# Patient Record
Sex: Female | Born: 1950 | Race: White | Hispanic: No | Smoking: Former smoker
Health system: Southern US, Community
[De-identification: ages and names within clinical notes are randomized; demographics above are authoritative.]

## PROBLEM LIST (undated history)

## (undated) DIAGNOSIS — M1990 Unspecified osteoarthritis, unspecified site: Secondary | ICD-10-CM

## (undated) DIAGNOSIS — I10 Essential (primary) hypertension: Secondary | ICD-10-CM

## (undated) DIAGNOSIS — F419 Anxiety disorder, unspecified: Secondary | ICD-10-CM

## (undated) DIAGNOSIS — F32A Depression, unspecified: Secondary | ICD-10-CM

## (undated) DIAGNOSIS — Z8489 Family history of other specified conditions: Secondary | ICD-10-CM

## (undated) HISTORY — PX: ANKLE FRACTURE SURGERY: SHX122

---

## 1979-12-09 DIAGNOSIS — O009 Unspecified ectopic pregnancy without intrauterine pregnancy: Secondary | ICD-10-CM

## 1979-12-09 HISTORY — DX: Unspecified ectopic pregnancy without intrauterine pregnancy: O00.90

## 1979-12-09 HISTORY — PX: UNILATERAL SALPINGECTOMY: SHX6160

## 2010-09-02 ENCOUNTER — Ambulatory Visit: Payer: Self-pay | Admitting: Emergency Medicine

## 2010-09-02 DIAGNOSIS — S81809A Unspecified open wound, unspecified lower leg, initial encounter: Secondary | ICD-10-CM

## 2010-09-02 DIAGNOSIS — S91009A Unspecified open wound, unspecified ankle, initial encounter: Secondary | ICD-10-CM

## 2010-09-02 DIAGNOSIS — S81009A Unspecified open wound, unspecified knee, initial encounter: Secondary | ICD-10-CM | POA: Insufficient documentation

## 2010-09-13 ENCOUNTER — Ambulatory Visit: Payer: Self-pay | Admitting: Emergency Medicine

## 2011-01-07 NOTE — Assessment & Plan Note (Signed)
Summary: CUT ON BACK OF RIGHT LEG/TJ   Vital Signs:  Patient Profile:   60 Years Old Female CC:      laceration to right lower thigh  Height:     62 inches Weight:      165 pounds O2 Sat:      98 % O2 treatment:    Room Air Temp:     98.4 degrees F oral Pulse rate:   91 / minute Resp:     14 per minute BP sitting:   167 / 91  (right arm) Cuff size:   large  Vitals Entered By: Lajean Saver RN (September 02, 2010 12:57 PM)                  Updated Prior Medication List: KEFLEX 500 MG CAPS (CEPHALEXIN) 1 cap by mouth three times a day for 7 days CLONAZEPAM 1 MG TABS (CLONAZEPAM)   Current Allergies: No known allergies History of Present Illness History from: patient Chief Complaint: laceration to right lower thigh  History of Present Illness: Laceration on her R leg.  She was at [employer] here in [place] and somehow the bathroom doorfell off the hinges and hit her in the leg when another customer opened the door.  She reports that the wooden door cut her her in the process.  Bleeding wass present but stopped with pressure.  She reports sharp constant pain.  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 She is able to walk normally.  She didn't have any other associated injuries.  REVIEW OF SYSTEMS Constitutional Symptoms      Denies fever, chills, night sweats, weight loss, weight gain, and fatigue.  Eyes       Denies change in vision, eye pain, eye discharge, glasses, contact lenses, and eye surgery. Ear/Nose/Throat/Mouth       Denies hearing loss/aids, change in hearing, ear pain, ear discharge, dizziness, frequent runny nose, frequent nose bleeds, sinus problems, sore throat, hoarseness, and tooth pain or bleeding.  Respiratory       Denies dry cough, productive cough, wheezing, shortness of breath, asthma, bronchitis, and emphysema/COPD.  Cardiovascular       Denies murmurs, chest pain, and tires easily with exhertion.    Gastrointestinal       Denies stomach  pain, nausea/vomiting, diarrhea, constipation, blood in bowel movements, and indigestion. Genitourniary       Denies painful urination, kidney stones, and loss of urinary control. Neurological       Denies paralysis, seizures, and fainting/blackouts. Musculoskeletal       Denies muscle pain, joint pain, joint stiffness, decreased range of motion, redness, swelling, muscle weakness, and gout.  Skin       Denies bruising, unusual mles/lumps or sores, and hair/skin or nail changes.      Comments: laceration to right thigh Psych       Denies mood changes, temper/anger issues, anxiety/stress, speech problems, depression, and sleep problems. Other Comments: Patient was coming into bathroom at [facility] when the door fell off the hinges, cutting her leg. She has a laceration to her right lower thigh. Her last TDap was about 7 years ago. TDaP given.   Past History:  Past Medical History: Ectopic pregnancy  Past Surgical History: Denies surgical history  Family History: NA  Social History: Never Smoked Alcohol use-yes 3-4/week Drug use-no Smoking Status:  never Drug Use:  no Physical Exam General appearance: well developed, well nourished, no acute distress Head: normocephalic, atraumatic  Extremities: FROM of her knee Neurological: normal sensation distally Skin: see below.  Also w/ associated bruising around the impact site MSE: oriented to time, place, and person R leg: right lateral distal thigh laceration approx 4.5cm in length in the shape of an upside down "V".  It is a superficial flap of skin and only down to the subcutaneous fat, not hitting any muscles or tendons.  The bleeding has stopped.  Mild tenderness to palpation.  No foreign bodies are seen after numbing and exploring the wound. Assessment New Problems: WOUND, OPEN, LEG, WITHOUT COMPLICATION (ICD-891.0)  R leg laceration  Patient Education: Patient and/or caregiver instructed in the following: rest, fluids,  Tylenol prn.  Plan New Medications/Changes: KEFLEX 500 MG CAPS (CEPHALEXIN) 1 cap by mouth three times a day for 7 days  #21 x 0, 09/02/2010, Hoyt Koch MD  New Orders: New Patient Level IV [phone] Repair Superficial Wound(s) 2.6cm to 7.5cm [12002] Dressing 4x4 UP [A6402] Tdap => 82yrs IM [90715] Admin 1st Vaccine [90471] Planning Comments:   Will treat with prophylactic antibiotics since I don't know how dirty or clean the door was and because it's a large cut Tetanus shot given because last one was 2005 No getting wound wet for 2 days.  Another day after that, should have would checked by a medical professional (here or PCP).  Then stitches will be removed 10 days from today. Avoid heavy exercise so that the wound doesn't reopen.  If so, please return to clinic or see PCP or ER.   The patient and/or caregiver has been counseled thoroughly with regard to medications prescribed including dosage, schedule, interactions, rationale for use, and possible side effects and they verbalize understanding.  Diagnoses and expected course of recovery discussed and will return if not improved as expected or if the condition worsens. Patient and/or caregiver verbalized understanding.   PROCEDURE:  Suture Site: R lateral thigh Size: 4cm in the shape of a "V" Number of Lacerations: 1 Procedure: Discussed benefits and risks of procedure and verbal consent obtained.  Using sterile technique and local 2% lidocaine with epinephrine, cleansed wound with betadine followed by copious lavage with normal saline.  Wound carefully inspected for debris and foreign bodies; none found.  Wound closed with #7 , 4-0 interrupted nylon sutures.  Bacitracin and non-stick sterile dressing applied.  Wound precautions explained to patient.  Prescriptions: KEFLEX 500 MG CAPS (CEPHALEXIN) 1 cap by mouth three times a day for 7 days  #21 x 0   Entered and Authorized by:   Hoyt Koch MD   Signed by:   Hoyt Koch MD on 09/02/2010   Method used:   Print then Give to Patient   RxID:   [id number]   Orders Added: 1)  New Patient Level IV [95621] 2)  Repair Superficial Wound(s) 2.6cm to 7.5cm [12002] 3)  Dressing 4x4 UP [A6402] 4)  Tdap => 21yrs IM [90715] 5)  Admin 1st Vaccine [30865]   Immunizations Administered:  Tetanus Vaccine:    Vaccine Type: Tdap    Site: left deltoid    Mfr: GlaxoSmithKline    Dose: 0.5 ml    Route: IM    Given by: Lajean Saver RN    Exp. Date: 08/28/2012    Lot #: [id number]    VIS given: 10/25/08 version given September 02, 2010.

## 2011-01-07 NOTE — Assessment & Plan Note (Signed)
Summary: suture removal   followup  Suture removal from posterior right leg. Temp: 98.6 BP: 158/95 HR: 85 RR: O2)2:95% Weight: 163 lbs  s: here for suture removal.  she did fine, no signs of infection.  no pain, no issues.  o: wound has closed well, no dehiscence.  no signs of infection (erythema, warmth, tenderness)  a: R leg wound  p: 7 sutures removed today with no difficulty.  then covered with steristrips.  keep leg clean, dry, and protected for another 1-2 weeks.

## 2011-12-09 DIAGNOSIS — C801 Malignant (primary) neoplasm, unspecified: Secondary | ICD-10-CM

## 2011-12-09 HISTORY — DX: Malignant (primary) neoplasm, unspecified: C80.1

## 2011-12-09 HISTORY — PX: MASTECTOMY: SHX3

## 2020-04-26 ENCOUNTER — Other Ambulatory Visit: Payer: Self-pay | Admitting: Internal Medicine

## 2020-04-26 DIAGNOSIS — R5381 Other malaise: Secondary | ICD-10-CM

## 2020-07-24 ENCOUNTER — Other Ambulatory Visit: Payer: Self-pay | Admitting: Internal Medicine

## 2020-07-24 DIAGNOSIS — M8580 Other specified disorders of bone density and structure, unspecified site: Secondary | ICD-10-CM

## 2020-10-29 DIAGNOSIS — L570 Actinic keratosis: Secondary | ICD-10-CM | POA: Diagnosis not present

## 2020-11-06 ENCOUNTER — Ambulatory Visit
Admission: RE | Admit: 2020-11-06 | Discharge: 2020-11-06 | Disposition: A | Discharge status: Home / Self Care | Payer: Medicare HMO | Source: Ambulatory Visit | Attending: Internal Medicine | Admitting: Internal Medicine

## 2020-11-06 ENCOUNTER — Other Ambulatory Visit: Payer: Self-pay

## 2020-11-06 DIAGNOSIS — Z780 Asymptomatic menopausal state: Secondary | ICD-10-CM | POA: Diagnosis not present

## 2020-11-06 DIAGNOSIS — M810 Age-related osteoporosis without current pathological fracture: Secondary | ICD-10-CM | POA: Diagnosis not present

## 2020-11-06 DIAGNOSIS — M8580 Other specified disorders of bone density and structure, unspecified site: Secondary | ICD-10-CM

## 2020-11-08 DIAGNOSIS — L570 Actinic keratosis: Secondary | ICD-10-CM | POA: Diagnosis not present

## 2022-06-12 ENCOUNTER — Ambulatory Visit: Payer: Medicare Other | Admitting: Orthopaedic Surgery

## 2022-06-12 ENCOUNTER — Ambulatory Visit (INDEPENDENT_AMBULATORY_CARE_PROVIDER_SITE_OTHER): Payer: Medicare Other

## 2022-06-12 VITALS — Ht 61.0 in | Wt 139.0 lb

## 2022-06-12 DIAGNOSIS — M1611 Unilateral primary osteoarthritis, right hip: Secondary | ICD-10-CM | POA: Diagnosis not present

## 2022-06-12 DIAGNOSIS — M25551 Pain in right hip: Secondary | ICD-10-CM | POA: Diagnosis not present

## 2022-06-12 NOTE — Progress Notes (Signed)
Office Visit Note   Patient: Virginia Castillo           Date of Birth: 10/19/1951           MRN: [id number] Visit Date: 06/12/2022              Requested by: Sueanne Margarita, [address],  [place] [address] PCP: Sueanne Margarita, DO   Assessment & Plan: Visit Diagnoses:  1. Primary osteoarthritis of right hip     Plan: Impression is advanced right hip DJD.  Conservative management have now ceased to provide any relief.  Pain is interfering with quality life and ADLs.  Based on her options she has elected to move forward with a right total hip replacement.  Risk benefits prognosis reviewed.  Explained that I typically will keep patients overnight for pain control, observation and mobilization with PT and discharge home the next morning.  She is agreeable to the plan.  Virginia Castillo met with the patient today.  Follow-Up Instructions: No follow-ups on file.   Orders:  Orders Placed This Encounter  Procedures   XR HIP UNILAT W OR W/O PELVIS 2-3 VIEWS RIGHT   No orders of the defined types were placed in this encounter.     Procedures: No procedures performed   Clinical Data: No additional findings.   Subjective: Chief Complaint  Patient presents with   Right Hip - Pain    HPI Virginia Castillo is a very pleasant 71 year old female here for advanced right hip DJD.  Had been a previous patient at [facility] and had seen Dr. Lyla Glassing and was scheduled for surgery but had to be canceled due to personal and family reasons.  She states that she subsequently did not have a good experience when she returned back to their office and requested a referral to our office for further evaluation and treatment of her right hip DJD.  Prior to her office she had undergone several cortisone injections in the hip which gave great but temporary relief.  Oral medications no longer provide relief.  She is unable to walk any significant distances without severe pain.  Review of Systems  Constitutional:  Negative.   HENT: Negative.    Eyes: Negative.   Respiratory: Negative.    Cardiovascular: Negative.   Endocrine: Negative.   Musculoskeletal: Negative.   Neurological: Negative.   Hematological: Negative.   Psychiatric/Behavioral: Negative.    All other systems reviewed and are negative.    Objective: Vital Signs: Ht $RemoveB'5\' 1"'XAQClAei$  (1.549 m)   Wt 139 lb (63 kg)   BMI 26.26 kg/m   Physical Exam Vitals and nursing note reviewed.  Constitutional:      Appearance: She is well-developed.  HENT:     Head: Normocephalic and atraumatic.  Pulmonary:     Effort: Pulmonary effort is normal.  Abdominal:     Palpations: Abdomen is soft.  Musculoskeletal:     Cervical back: Neck supple.  Skin:    General: Skin is warm.     Capillary Refill: Capillary refill takes less than 2 seconds.  Neurological:     Mental Status: She is alert and oriented to person, place, and time.  Psychiatric:        Behavior: Behavior normal.        Thought Content: Thought content normal.        Judgment: Judgment normal.     Ortho Exam Examination of the right hip shows antalgic gait.  Pain with hip flexion and circumduction as well  as internal rotation. Specialty Comments:  No specialty comments available.  Imaging: XR HIP UNILAT W OR W/O PELVIS 2-3 VIEWS RIGHT  Result Date: 06/12/2022 Advanced degenerative joint disease with bone-on-bone joint space narrowing of right hip joint.     PMFS History: Patient Active Problem List   Diagnosis Date Noted   Primary osteoarthritis of right hip 06/12/2022   WOUND, OPEN, LEG, WITHOUT COMPLICATION 43/12/4838   No past medical history on file.  No family history on file.   Social History   Occupational History   Not on file  Tobacco Use   Smoking status: Not on file   Smokeless tobacco: Not on file  Substance and Sexual Activity   Alcohol use: Not on file   Drug use: Not on file   Sexual activity: Not on file

## 2022-06-16 ENCOUNTER — Other Ambulatory Visit: Payer: Self-pay

## 2022-06-23 ENCOUNTER — Telehealth: Payer: Self-pay | Admitting: Orthopaedic Surgery

## 2022-06-23 NOTE — Telephone Encounter (Signed)
Called and LMOM that handicap placard application is up front.

## 2022-06-23 NOTE — Telephone Encounter (Signed)
yes

## 2022-06-23 NOTE — Telephone Encounter (Signed)
Pt called requesting another handicap placid form. Pt states she lost her's to take to the [facility]. Please call pt when ready for pick up. Pt phone number is [phone].

## 2022-07-07 NOTE — Progress Notes (Signed)
Surgical Instructions    Your procedure is scheduled on Wednesday August 9th.  Report to [facility] Main Entrance "A" at 945 A.M., then check in with the Admitting office.  Call this number if you have problems the morning of surgery:  [phone]   If you have any questions prior to your surgery date call [phone]: Open Monday-Friday 8am-4pm    Remember:  Do not eat after midnight the night before your surgery  You may drink clear liquids until 915 the morning of your surgery.   Clear liquids allowed are: Water, Non-Citrus Juices (without pulp), Carbonated Beverages, Clear Tea, Black Coffee ONLY (NO MILK, CREAM OR POWDERED CREAMER of any kind), and Gatorade   Enhanced Recovery after Surgery for Orthopedics Enhanced Recovery after Surgery is a protocol used to improve the stress on your body and your recovery after surgery.  Patient Instructions  The day of surgery (if you do NOT have diabetes):  Drink ONE (1) Pre-Surgery Clear Ensure by ___915__ am the morning of surgery   This drink was given to you during your hospital  pre-op appointment visit. Nothing else to drink after completing the  Pre-Surgery Clear Ensure.         If you have questions, please contact your surgeon's office.     Take these medicines the morning of surgery with A SIP OF WATER: amLODipine (NORVASC) 5 MG tablet atorvastatin (LIPITOR) 40 MG tablet sertraline (ZOLOFT) 50 MG tablet   As of today, STOP taking any Aspirin (unless otherwise instructed by your surgeon) Voltaren, Aleve, Naproxen, Ibuprofen, Motrin, Advil, Goody's, BC's, all herbal medications, fish oil, and all vitamins.           Do not wear jewelry or makeup Do not wear lotions, powders, perfumes, or deodorant. Do not shave 48 hours prior to surgery. Do not bring valuables to the hospital. Do not wear nail polish, gel polish, artificial nails, or any other type of covering on natural nails (fingers and toes) If you have artificial  nails or gel coating that need to be removed by a nail salon, please have this removed prior to surgery. Artificial nails or gel coating may interfere with anesthesia's ability to adequately monitor your vital signs.  [facility] is not responsible for any belongings or valuables. .   Do NOT Smoke (Tobacco/Vaping)  24 hours prior to your procedure  If you use a CPAP at night, you may bring your mask for your overnight stay.   Contacts, glasses, hearing aids, dentures or partials may not be worn into surgery, please bring cases for these belongings   For patients admitted to the hospital, discharge time will be determined by your treatment team.   Patients discharged the day of surgery will not be allowed to drive home, and someone needs to stay with them for 24 hours.   SURGICAL WAITING ROOM VISITATION Patients having surgery or a procedure may have no more than 2 support people in the waiting area - these visitors may rotate.   Children under the age of 28 must have an adult with them who is not the patient. If the patient needs to stay at the hospital during part of their recovery, the visitor guidelines for inpatient rooms apply. Pre-op nurse will coordinate an appropriate time for 1 support person to accompany patient in pre-op.  This support person may not rotate.   Please refer to the [facility] website for the visitor guidelines for Inpatients (after your surgery is over and you are  in a regular room).    Special instructions:    Oral Hygiene is also important to reduce your risk of infection.  Remember - BRUSH YOUR TEETH THE MORNING OF SURGERY WITH YOUR REGULAR TOOTHPASTE   [facility]- Preparing For Surgery  Before surgery, you can play an important role. Because skin is not sterile, your skin needs to be as free of germs as possible. You can reduce the number of germs on your skin by washing with CHG (chlorahexidine gluconate) Soap before surgery.  CHG is an antiseptic  cleaner which kills germs and bonds with the skin to continue killing germs even after washing.     Please do not use if you have an allergy to CHG or antibacterial soaps. If your skin becomes reddened/irritated stop using the CHG.  Do not shave (including legs and underarms) for at least 48 hours prior to first CHG shower. It is OK to shave your face.  Please follow these instructions carefully.     Shower the NIGHT BEFORE SURGERY and the MORNING OF SURGERY with CHG Soap.   If you chose to wash your hair, wash your hair first as usual with your normal shampoo. After you shampoo, rinse your hair and body thoroughly to remove the shampoo.  Then [employer] and genitals (private parts) with your normal soap and rinse thoroughly to remove soap.  After that Use CHG Soap as you would any other liquid soap. You can apply CHG directly to the skin and wash gently with a scrungie or a clean washcloth.   Apply the CHG Soap to your body ONLY FROM THE NECK DOWN.  Do not use on open wounds or open sores. Avoid contact with your eyes, ears, mouth and genitals (private parts). Wash Face and genitals (private parts)  with your normal soap.   Wash thoroughly, paying special attention to the area where your surgery will be performed.  Thoroughly rinse your body with warm water from the neck down.  DO NOT shower/wash with your normal soap after using and rinsing off the CHG Soap.  Pat yourself dry with a CLEAN TOWEL.  Wear CLEAN PAJAMAS to bed the night before surgery  Place CLEAN SHEETS on your bed the night before your surgery  DO NOT SLEEP WITH PETS.   Day of Surgery:  Take a shower with CHG soap. Wear Clean/Comfortable clothing the morning of surgery Do not apply any deodorants/lotions.   Remember to brush your teeth WITH YOUR REGULAR TOOTHPASTE.    If you received a COVID test during your pre-op visit, it is requested that you wear a mask when out in public, stay away from anyone that may not  be feeling well, and notify your surgeon if you develop symptoms. If you have been in contact with anyone that has tested positive in the last 10 days, please notify your surgeon.    Please read over the following fact sheets that you were given.

## 2022-07-08 ENCOUNTER — Encounter (HOSPITAL_COMMUNITY)
Admission: RE | Admit: 2022-07-08 | Discharge: 2022-07-08 | Disposition: A | Discharge status: Home / Self Care | Payer: Medicare Other | Source: Ambulatory Visit | Attending: Orthopaedic Surgery | Admitting: Orthopaedic Surgery

## 2022-07-08 ENCOUNTER — Other Ambulatory Visit: Payer: Self-pay

## 2022-07-08 ENCOUNTER — Encounter (HOSPITAL_COMMUNITY): Payer: Self-pay | Admitting: *Deleted

## 2022-07-08 VITALS — BP 147/89 | HR 78 | Temp 98.2°F | Ht 61.0 in | Wt 139.8 lb

## 2022-07-08 DIAGNOSIS — M1611 Unilateral primary osteoarthritis, right hip: Secondary | ICD-10-CM | POA: Diagnosis not present

## 2022-07-08 DIAGNOSIS — Z01818 Encounter for other preprocedural examination: Secondary | ICD-10-CM

## 2022-07-08 HISTORY — DX: Essential (primary) hypertension: I10

## 2022-07-08 HISTORY — DX: Anxiety disorder, unspecified: F41.9

## 2022-07-08 HISTORY — DX: Family history of other specified conditions: Z84.89

## 2022-07-08 HISTORY — DX: Depression, unspecified: F32.A

## 2022-07-08 LAB — COMPREHENSIVE METABOLIC PANEL
ALT: 16 U/L (ref 0–44)
AST: 22 U/L (ref 15–41)
Albumin: 4 g/dL (ref 3.5–5.0)
Alkaline Phosphatase: 52 U/L (ref 38–126)
Anion gap: 7 (ref 5–15)
BUN: 15 mg/dL (ref 8–23)
CO2: 29 mmol/L (ref 22–32)
Calcium: 9.4 mg/dL (ref 8.9–10.3)
Chloride: 102 mmol/L (ref 98–111)
Creatinine, Ser: 0.64 mg/dL (ref 0.44–1.00)
GFR, Estimated: >60 mL/min (ref >60)
Glucose, Bld: 97 mg/dL (ref 70–99)
Potassium: 3.5 mmol/L (ref 3.5–5.1)
Sodium: 138 mmol/L (ref 135–145)
Total Bilirubin: 0.7 mg/dL (ref 0.3–1.2)
Total Protein: 7.3 g/dL (ref 6.5–8.1)

## 2022-07-08 LAB — TYPE AND SCREEN
ABO/RH(D): A POS
Antibody Screen: NEGATIVE

## 2022-07-08 LAB — CBC
HCT: 41.6 % (ref 36.0–46.0)
Hemoglobin: 13.1 g/dL (ref 12.0–15.0)
MCH: 29.6 pg (ref 26.0–34.0)
MCHC: 31.5 g/dL (ref 30.0–36.0)
MCV: 93.9 fL (ref 80.0–100.0)
Platelets: 232 10*3/uL (ref 150–400)
RBC: 4.43 MIL/uL (ref 3.87–5.11)
RDW: 12.4 % (ref 11.5–15.5)
WBC: 6.1 10*3/uL (ref 4.0–10.5)
nRBC: 0 % (ref 0.0–0.2)

## 2022-07-08 LAB — SURGICAL PCR SCREEN
MRSA, PCR: NEGATIVE
Staphylococcus aureus: NEGATIVE

## 2022-07-08 NOTE — Progress Notes (Signed)
Per Maudie Mercury, in Pharmacy, Pt's med rec has been completed by [place] although status is showing as "none." Pt confirms that med rec was completed prior to PAT appt and no changes have been made to any medications.

## 2022-07-08 NOTE — Progress Notes (Signed)
PCP - Dr. Sueanne Margarita Cardiologist - denies  PPM/ICD - denies   Chest x-ray - 10/28/16 EKG - 07/08/22 Stress Test - 05/18/18 ECHO - 05/11/18 Cardiac Cath - denies  Sleep Study - denies   DM- denies  ASA/Blood Thinner Instructions: n/a   ERAS Protcol - yes PRE-SURGERY Ensure given at PAT  COVID TEST- n/a   Anesthesia review: no  Patient denies shortness of breath, fever, cough and chest pain at PAT appointment   All instructions explained to the patient, with a verbal understanding of the material. Patient agrees to go over the instructions while at home for a better understanding. The opportunity to ask questions was provided.

## 2022-07-11 ENCOUNTER — Other Ambulatory Visit: Payer: Self-pay | Admitting: Physician Assistant

## 2022-07-11 MED ORDER — ASPIRIN 81 MG PO TBEC: 81.0000 mg | DELAYED_RELEASE_TABLET | Freq: Two times a day (BID) | ORAL | 0 refills | Status: DC | PRN

## 2022-07-11 MED ORDER — METHOCARBAMOL 500 MG PO TABS: 500.0000 mg | ORAL_TABLET | Freq: Two times a day (BID) | ORAL | 2 refills | Status: DC | PRN

## 2022-07-11 MED ORDER — OXYCODONE-ACETAMINOPHEN 5-325 MG PO TABS: 1.0000 | ORAL_TABLET | Freq: Four times a day (QID) | ORAL | 0 refills | Status: DC | PRN

## 2022-07-11 MED ORDER — DOCUSATE SODIUM 100 MG PO CAPS: 100.0000 mg | ORAL_CAPSULE | Freq: Every day | ORAL | 2 refills | Status: DC | PRN

## 2022-07-11 MED ORDER — ONDANSETRON HCL 4 MG PO TABS: 4.0000 mg | ORAL_TABLET | Freq: Three times a day (TID) | ORAL | 0 refills | Status: DC | PRN

## 2022-07-12 ENCOUNTER — Other Ambulatory Visit: Payer: Self-pay

## 2022-07-12 ENCOUNTER — Encounter (HOSPITAL_BASED_OUTPATIENT_CLINIC_OR_DEPARTMENT_OTHER): Payer: Self-pay

## 2022-07-12 ENCOUNTER — Emergency Department (HOSPITAL_BASED_OUTPATIENT_CLINIC_OR_DEPARTMENT_OTHER)
Admission: EM | Admit: 2022-07-12 | Discharge: 2022-07-12 | Discharge status: Against Medical Advice | Payer: Medicare Other | Attending: Emergency Medicine | Admitting: Emergency Medicine

## 2022-07-12 DIAGNOSIS — Z5321 Procedure and treatment not carried out due to patient leaving prior to being seen by health care provider: Secondary | ICD-10-CM | POA: Insufficient documentation

## 2022-07-12 DIAGNOSIS — S60221A Contusion of right hand, initial encounter: Secondary | ICD-10-CM | POA: Insufficient documentation

## 2022-07-12 DIAGNOSIS — W5501XA Bitten by cat, initial encounter: Secondary | ICD-10-CM | POA: Insufficient documentation

## 2022-07-12 DIAGNOSIS — S6991XA Unspecified injury of right wrist, hand and finger(s), initial encounter: Secondary | ICD-10-CM | POA: Diagnosis present

## 2022-07-12 NOTE — ED Triage Notes (Signed)
Pt presents to the ED with cat scratch to the right hand. Bleeding controlled at this time. Bruising noted.

## 2022-07-12 NOTE — ED Notes (Signed)
Pt taken back to room 8. Inquiring about wait time now that she is in the room. Explained that I was not able to give an exact time for the provider,  but that she will be seen as soon as possible. Pt and her daughter started yelling at this RN and the NT that she has been up all day and she is not waiting any longer to be seen.Stated they "have things to do tomorrow"  Encouraged pt to stay, declined and threw her blanket at the NT as she was walking out the door.

## 2022-07-16 ENCOUNTER — Ambulatory Visit (HOSPITAL_BASED_OUTPATIENT_CLINIC_OR_DEPARTMENT_OTHER): Payer: Medicare Other | Admitting: Certified Registered Nurse Anesthetist

## 2022-07-16 ENCOUNTER — Ambulatory Visit (HOSPITAL_COMMUNITY): Payer: Medicare Other

## 2022-07-16 ENCOUNTER — Other Ambulatory Visit: Payer: Self-pay

## 2022-07-16 ENCOUNTER — Encounter (HOSPITAL_COMMUNITY): Payer: Self-pay | Admitting: Orthopaedic Surgery

## 2022-07-16 ENCOUNTER — Ambulatory Visit (HOSPITAL_COMMUNITY): Payer: Medicare Other | Admitting: Certified Registered Nurse Anesthetist

## 2022-07-16 ENCOUNTER — Observation Stay (HOSPITAL_COMMUNITY): Payer: Medicare Other

## 2022-07-16 ENCOUNTER — Observation Stay (HOSPITAL_COMMUNITY)
Admission: RE | Admit: 2022-07-16 | Discharge: 2022-07-18 | Disposition: A | Discharge status: Home Health | Payer: Medicare Other | Attending: Orthopaedic Surgery | Admitting: Orthopaedic Surgery

## 2022-07-16 ENCOUNTER — Other Ambulatory Visit: Payer: Self-pay | Admitting: Physician Assistant

## 2022-07-16 ENCOUNTER — Encounter (HOSPITAL_COMMUNITY)
Admission: RE | Disposition: A | Discharge status: Home Health | Payer: Self-pay | Source: Home / Self Care | Attending: Orthopaedic Surgery

## 2022-07-16 DIAGNOSIS — Z96641 Presence of right artificial hip joint: Secondary | ICD-10-CM

## 2022-07-16 DIAGNOSIS — Z7982 Long term (current) use of aspirin: Secondary | ICD-10-CM | POA: Insufficient documentation

## 2022-07-16 DIAGNOSIS — M1611 Unilateral primary osteoarthritis, right hip: Principal | ICD-10-CM | POA: Insufficient documentation

## 2022-07-16 DIAGNOSIS — Z79899 Other long term (current) drug therapy: Secondary | ICD-10-CM | POA: Insufficient documentation

## 2022-07-16 DIAGNOSIS — I10 Essential (primary) hypertension: Secondary | ICD-10-CM | POA: Diagnosis not present

## 2022-07-16 DIAGNOSIS — Z87891 Personal history of nicotine dependence: Secondary | ICD-10-CM | POA: Insufficient documentation

## 2022-07-16 DIAGNOSIS — Z853 Personal history of malignant neoplasm of breast: Secondary | ICD-10-CM | POA: Diagnosis not present

## 2022-07-16 HISTORY — PX: TOTAL HIP ARTHROPLASTY: SHX124

## 2022-07-16 LAB — ABO/RH: ABO/RH(D): A POS

## 2022-07-16 SURGERY — ARTHROPLASTY, HIP, TOTAL, ANTERIOR APPROACH
Anesthesia: Spinal | Site: Hip | Laterality: Right

## 2022-07-16 MED ORDER — TRANEXAMIC ACID-NACL 1000-0.7 MG/100ML-% IV SOLN
INTRAVENOUS | Status: AC
  Filled 2022-07-16: qty 100

## 2022-07-16 MED ORDER — SODIUM CHLORIDE 0.9 % IV SOLN: INTRAVENOUS | Status: DC

## 2022-07-16 MED ORDER — METOCLOPRAMIDE HCL 5 MG PO TABS: 5.0000 mg | ORAL_TABLET | Freq: Three times a day (TID) | ORAL | Status: DC | PRN

## 2022-07-16 MED ORDER — CHLORHEXIDINE GLUCONATE 0.12 % MT SOLN: 15.0000 mL | Freq: Once | OROMUCOSAL | Status: AC

## 2022-07-16 MED ORDER — BUPIVACAINE-MELOXICAM ER 400-12 MG/14ML IJ SOLN
INTRAMUSCULAR | Status: AC
  Filled 2022-07-16: qty 1

## 2022-07-16 MED ORDER — CLONAZEPAM 0.5 MG PO TABS
0.5000 mg | ORAL_TABLET | Freq: Every day | ORAL | Status: DC | PRN
  Administered 2022-07-17: 0.5 mg via ORAL
  Filled 2022-07-16: qty 1

## 2022-07-16 MED ORDER — LACTATED RINGERS IV SOLN: INTRAVENOUS | Status: DC

## 2022-07-16 MED ORDER — ACETAMINOPHEN 10 MG/ML IV SOLN
INTRAVENOUS | Status: DC | PRN
  Administered 2022-07-16: 1000 mg via INTRAVENOUS

## 2022-07-16 MED ORDER — BUPIVACAINE-MELOXICAM ER 400-12 MG/14ML IJ SOLN
INTRAMUSCULAR | Status: DC | PRN
  Administered 2022-07-16: 400 mg

## 2022-07-16 MED ORDER — POVIDONE-IODINE 10 % EX SWAB
2.0000 | Freq: Once | CUTANEOUS | Status: AC
  Administered 2022-07-16: 2 via TOPICAL

## 2022-07-16 MED ORDER — PROPOFOL 10 MG/ML IV BOLUS
INTRAVENOUS | Status: DC | PRN
  Administered 2022-07-16: 25 mg via INTRAVENOUS
  Administered 2022-07-16: 15 mg via INTRAVENOUS

## 2022-07-16 MED ORDER — AMOXICILLIN-POT CLAVULANATE 875-125 MG PO TABS
1.0000 | ORAL_TABLET | Freq: Two times a day (BID) | ORAL | 0 refills | Status: DC
Start: 2022-07-16 — End: 2024-01-02

## 2022-07-16 MED ORDER — ACETAMINOPHEN 500 MG PO TABS
1000.0000 mg | ORAL_TABLET | Freq: Four times a day (QID) | ORAL | Status: AC
  Administered 2022-07-17 (×2): 1000 mg via ORAL
  Filled 2022-07-16 (×3): qty 2

## 2022-07-16 MED ORDER — PHENYLEPHRINE HCL-NACL 20-0.9 MG/250ML-% IV SOLN
INTRAVENOUS | Status: DC | PRN
  Administered 2022-07-16: 25 ug/min via INTRAVENOUS

## 2022-07-16 MED ORDER — SORBITOL 70 % SOLN: 30.0000 mL | Freq: Every day | Status: DC | PRN

## 2022-07-16 MED ORDER — TRANEXAMIC ACID 1000 MG/10ML IV SOLN
INTRAVENOUS | Status: DC | PRN
  Administered 2022-07-16: 2000 mg via TOPICAL

## 2022-07-16 MED ORDER — METHOCARBAMOL 500 MG PO TABS
500.0000 mg | ORAL_TABLET | Freq: Four times a day (QID) | ORAL | Status: DC | PRN
  Administered 2022-07-16: 500 mg via ORAL
  Filled 2022-07-16: qty 1

## 2022-07-16 MED ORDER — DOCUSATE SODIUM 100 MG PO CAPS
100.0000 mg | ORAL_CAPSULE | Freq: Two times a day (BID) | ORAL | Status: DC
  Administered 2022-07-16 – 2022-07-18 (×4): 100 mg via ORAL
  Filled 2022-07-16 (×4): qty 1

## 2022-07-16 MED ORDER — METHOCARBAMOL 1000 MG/10ML IJ SOLN
500.0000 mg | Freq: Four times a day (QID) | INTRAVENOUS | Status: DC | PRN
  Filled 2022-07-16: qty 5

## 2022-07-16 MED ORDER — POLYETHYLENE GLYCOL 3350 17 G PO PACK
17.0000 g | PACK | Freq: Every day | ORAL | Status: DC
  Administered 2022-07-16: 17 g via ORAL
  Filled 2022-07-16 (×2): qty 1

## 2022-07-16 MED ORDER — FENTANYL CITRATE (PF) 100 MCG/2ML IJ SOLN: 25.0000 ug | INTRAMUSCULAR | Status: DC | PRN

## 2022-07-16 MED ORDER — PROPOFOL 500 MG/50ML IV EMUL
INTRAVENOUS | Status: DC | PRN
  Administered 2022-07-16: 75 ug/kg/min via INTRAVENOUS

## 2022-07-16 MED ORDER — CEFAZOLIN SODIUM-DEXTROSE 2-4 GM/100ML-% IV SOLN
2.0000 g | INTRAVENOUS | Status: AC
  Administered 2022-07-16: 2 g via INTRAVENOUS

## 2022-07-16 MED ORDER — MENTHOL 3 MG MT LOZG: 1.0000 | LOZENGE | OROMUCOSAL | Status: DC | PRN

## 2022-07-16 MED ORDER — CEFAZOLIN SODIUM-DEXTROSE 2-4 GM/100ML-% IV SOLN
INTRAVENOUS | Status: AC
  Filled 2022-07-16: qty 100

## 2022-07-16 MED ORDER — ASPIRIN 81 MG PO CHEW
81.0000 mg | CHEWABLE_TABLET | Freq: Two times a day (BID) | ORAL | Status: DC
  Administered 2022-07-16 – 2022-07-18 (×4): 81 mg via ORAL
  Filled 2022-07-16 (×4): qty 1

## 2022-07-16 MED ORDER — FERROUS SULFATE 325 (65 FE) MG PO TABS
325.0000 mg | ORAL_TABLET | Freq: Three times a day (TID) | ORAL | Status: DC
  Administered 2022-07-16 – 2022-07-18 (×5): 325 mg via ORAL
  Filled 2022-07-16 (×5): qty 1

## 2022-07-16 MED ORDER — METOCLOPRAMIDE HCL 5 MG/ML IJ SOLN: 5.0000 mg | Freq: Three times a day (TID) | INTRAMUSCULAR | Status: DC | PRN

## 2022-07-16 MED ORDER — DEXAMETHASONE SODIUM PHOSPHATE 10 MG/ML IJ SOLN
10.0000 mg | Freq: Once | INTRAMUSCULAR | Status: AC
Start: 2022-07-17 — End: 2022-07-17
  Administered 2022-07-17: 10 mg via INTRAVENOUS
  Filled 2022-07-16: qty 1

## 2022-07-16 MED ORDER — OXYCODONE HCL 5 MG PO TABS
10.0000 mg | ORAL_TABLET | ORAL | Status: DC | PRN
  Administered 2022-07-16 – 2022-07-17 (×2): 10 mg via ORAL

## 2022-07-16 MED ORDER — TRANEXAMIC ACID-NACL 1000-0.7 MG/100ML-% IV SOLN
1000.0000 mg | Freq: Once | INTRAVENOUS | Status: AC
  Administered 2022-07-16: 1000 mg via INTRAVENOUS
  Filled 2022-07-16: qty 100

## 2022-07-16 MED ORDER — ALUM & MAG HYDROXIDE-SIMETH 200-200-20 MG/5ML PO SUSP: 30.0000 mL | ORAL | Status: DC | PRN

## 2022-07-16 MED ORDER — OXYCODONE HCL ER 10 MG PO T12A
10.0000 mg | EXTENDED_RELEASE_TABLET | Freq: Two times a day (BID) | ORAL | Status: DC
  Administered 2022-07-16 – 2022-07-18 (×4): 10 mg via ORAL
  Filled 2022-07-16 (×4): qty 1

## 2022-07-16 MED ORDER — AMISULPRIDE (ANTIEMETIC) 5 MG/2ML IV SOLN: 10.0000 mg | Freq: Once | INTRAVENOUS | Status: DC | PRN

## 2022-07-16 MED ORDER — CHLORHEXIDINE GLUCONATE 0.12 % MT SOLN
OROMUCOSAL | Status: AC
  Administered 2022-07-16: 15 mL via OROMUCOSAL
  Filled 2022-07-16: qty 15

## 2022-07-16 MED ORDER — LIDOCAINE 2% (20 MG/ML) 5 ML SYRINGE
INTRAMUSCULAR | Status: AC
  Filled 2022-07-16: qty 5

## 2022-07-16 MED ORDER — MAGNESIUM CITRATE PO SOLN: 1.0000 | Freq: Once | ORAL | Status: DC | PRN

## 2022-07-16 MED ORDER — OXYCODONE HCL 5 MG PO TABS: 5.0000 mg | ORAL_TABLET | Freq: Once | ORAL | Status: DC | PRN

## 2022-07-16 MED ORDER — ACETAMINOPHEN 325 MG PO TABS
325.0000 mg | ORAL_TABLET | Freq: Four times a day (QID) | ORAL | Status: DC | PRN
  Administered 2022-07-18: 650 mg via ORAL
  Filled 2022-07-16: qty 2

## 2022-07-16 MED ORDER — PHENOL 1.4 % MT LIQD: 1.0000 | OROMUCOSAL | Status: DC | PRN

## 2022-07-16 MED ORDER — FENTANYL CITRATE (PF) 250 MCG/5ML IJ SOLN
INTRAMUSCULAR | Status: DC | PRN
  Administered 2022-07-16: 50 ug via INTRAVENOUS

## 2022-07-16 MED ORDER — MIDAZOLAM HCL 2 MG/2ML IJ SOLN
INTRAMUSCULAR | Status: DC | PRN
  Administered 2022-07-16: 2 mg via INTRAVENOUS

## 2022-07-16 MED ORDER — SODIUM CHLORIDE 0.9 % IR SOLN
Status: DC | PRN
  Administered 2022-07-16: 1000 mL

## 2022-07-16 MED ORDER — CEFAZOLIN SODIUM-DEXTROSE 2-4 GM/100ML-% IV SOLN
2.0000 g | Freq: Four times a day (QID) | INTRAVENOUS | Status: AC
  Administered 2022-07-16 (×2): 2 g via INTRAVENOUS
  Filled 2022-07-16: qty 100

## 2022-07-16 MED ORDER — 0.9 % SODIUM CHLORIDE (POUR BTL) OPTIME
TOPICAL | Status: DC | PRN
  Administered 2022-07-16: 1000 mL

## 2022-07-16 MED ORDER — LIDOCAINE 2% (20 MG/ML) 5 ML SYRINGE
INTRAMUSCULAR | Status: DC | PRN
  Administered 2022-07-16: 40 mg via INTRAVENOUS

## 2022-07-16 MED ORDER — ORAL CARE MOUTH RINSE: 15.0000 mL | Freq: Once | OROMUCOSAL | Status: AC

## 2022-07-16 MED ORDER — TRANEXAMIC ACID 1000 MG/10ML IV SOLN
2000.0000 mg | INTRAVENOUS | Status: DC
  Filled 2022-07-16: qty 20

## 2022-07-16 MED ORDER — VANCOMYCIN HCL 1000 MG IV SOLR
INTRAVENOUS | Status: AC
  Filled 2022-07-16: qty 20

## 2022-07-16 MED ORDER — HYDROMORPHONE HCL 1 MG/ML IJ SOLN: 0.5000 mg | INTRAMUSCULAR | Status: DC | PRN

## 2022-07-16 MED ORDER — AMLODIPINE BESYLATE 5 MG PO TABS
5.0000 mg | ORAL_TABLET | Freq: Every day | ORAL | Status: DC
  Administered 2022-07-17 – 2022-07-18 (×2): 5 mg via ORAL
  Filled 2022-07-16 (×2): qty 1

## 2022-07-16 MED ORDER — VANCOMYCIN HCL 1 G IV SOLR
INTRAVENOUS | Status: DC | PRN
  Administered 2022-07-16: 1000 mg

## 2022-07-16 MED ORDER — OXYCODONE HCL 5 MG PO TABS
ORAL_TABLET | ORAL | Status: AC
  Filled 2022-07-16: qty 2

## 2022-07-16 MED ORDER — DIPHENHYDRAMINE HCL 12.5 MG/5ML PO ELIX: 25.0000 mg | ORAL_SOLUTION | ORAL | Status: DC | PRN

## 2022-07-16 MED ORDER — KETOROLAC TROMETHAMINE 30 MG/ML IJ SOLN
INTRAMUSCULAR | Status: DC | PRN
  Administered 2022-07-16: 15 mg via INTRAVENOUS

## 2022-07-16 MED ORDER — MIDAZOLAM HCL 2 MG/2ML IJ SOLN
INTRAMUSCULAR | Status: AC
  Filled 2022-07-16: qty 2

## 2022-07-16 MED ORDER — ACETAMINOPHEN 10 MG/ML IV SOLN
INTRAVENOUS | Status: AC
  Filled 2022-07-16: qty 100

## 2022-07-16 MED ORDER — EPHEDRINE SULFATE-NACL 50-0.9 MG/10ML-% IV SOSY
PREFILLED_SYRINGE | INTRAVENOUS | Status: DC | PRN
  Administered 2022-07-16 (×3): 5 mg via INTRAVENOUS

## 2022-07-16 MED ORDER — EPHEDRINE 5 MG/ML INJ
INTRAVENOUS | Status: AC
  Filled 2022-07-16: qty 5

## 2022-07-16 MED ORDER — ONDANSETRON HCL 4 MG/2ML IJ SOLN: 4.0000 mg | Freq: Four times a day (QID) | INTRAMUSCULAR | Status: DC | PRN

## 2022-07-16 MED ORDER — FENTANYL CITRATE (PF) 250 MCG/5ML IJ SOLN
INTRAMUSCULAR | Status: AC
  Filled 2022-07-16: qty 5

## 2022-07-16 MED ORDER — PANTOPRAZOLE SODIUM 40 MG PO TBEC
40.0000 mg | DELAYED_RELEASE_TABLET | Freq: Every day | ORAL | Status: DC
  Administered 2022-07-16 – 2022-07-18 (×3): 40 mg via ORAL
  Filled 2022-07-16 (×3): qty 1

## 2022-07-16 MED ORDER — TRANEXAMIC ACID-NACL 1000-0.7 MG/100ML-% IV SOLN
1000.0000 mg | INTRAVENOUS | Status: AC
  Administered 2022-07-16: 1000 mg via INTRAVENOUS

## 2022-07-16 MED ORDER — OXYCODONE HCL 5 MG PO TABS
5.0000 mg | ORAL_TABLET | ORAL | Status: DC | PRN
  Filled 2022-07-16: qty 2

## 2022-07-16 MED ORDER — ONDANSETRON HCL 4 MG PO TABS: 4.0000 mg | ORAL_TABLET | Freq: Four times a day (QID) | ORAL | Status: DC | PRN

## 2022-07-16 MED ORDER — IRRISEPT - 450ML BOTTLE WITH 0.05% CHG IN STERILE WATER, USP 99.95% OPTIME
TOPICAL | Status: DC | PRN
  Administered 2022-07-16: 450 mL via TOPICAL

## 2022-07-16 MED ORDER — TRAZODONE HCL 150 MG PO TABS
150.0000 mg | ORAL_TABLET | Freq: Every day | ORAL | Status: DC
  Administered 2022-07-16 – 2022-07-17 (×2): 150 mg via ORAL
  Filled 2022-07-16 (×3): qty 1

## 2022-07-16 MED ORDER — OXYCODONE HCL 5 MG/5ML PO SOLN: 5.0000 mg | Freq: Once | ORAL | Status: DC | PRN

## 2022-07-16 MED ORDER — BUPIVACAINE IN DEXTROSE 0.75-8.25 % IT SOLN
INTRATHECAL | Status: DC | PRN
  Administered 2022-07-16: 1.8 mL via INTRATHECAL

## 2022-07-16 SURGICAL SUPPLY — 68 items
BAG COUNTER SPONGE SURGICOUNT (BAG) ×2 IMPLANT
BAG DECANTER FOR FLEXI CONT (MISCELLANEOUS) ×2 IMPLANT
BALL HIP CERAMIC (Hips) IMPLANT
CELLS DAT CNTRL 66122 CELL SVR (MISCELLANEOUS) IMPLANT
COVER PERINEAL POST (MISCELLANEOUS) ×2 IMPLANT
COVER SURGICAL LIGHT HANDLE (MISCELLANEOUS) ×2 IMPLANT
CUP ACET PINNACLE SECTR 48MM (Joint) IMPLANT
DERMABOND ADVANCED (GAUZE/BANDAGES/DRESSINGS) ×1
DERMABOND ADVANCED .7 DNX12 (GAUZE/BANDAGES/DRESSINGS) IMPLANT
DRAPE C-ARM 42X72 X-RAY (DRAPES) ×2 IMPLANT
DRAPE POUCH INSTRU U-SHP 10X18 (DRAPES) ×2 IMPLANT
DRAPE STERI IOBAN 125X83 (DRAPES) ×2 IMPLANT
DRAPE U-SHAPE 47X51 STRL (DRAPES) ×4 IMPLANT
DRSG AQUACEL AG ADV 3.5X10 (GAUZE/BANDAGES/DRESSINGS) ×2 IMPLANT
DURAPREP 26ML APPLICATOR (WOUND CARE) ×4 IMPLANT
ELECT BLADE 4.0 EZ CLEAN MEGAD (MISCELLANEOUS) ×2
ELECT REM PT RETURN 9FT ADLT (ELECTROSURGICAL) ×2
ELECTRODE BLDE 4.0 EZ CLN MEGD (MISCELLANEOUS) ×1 IMPLANT
ELECTRODE REM PT RTRN 9FT ADLT (ELECTROSURGICAL) ×1 IMPLANT
GLOVE BIOGEL PI IND STRL 7.0 (GLOVE) ×1 IMPLANT
GLOVE BIOGEL PI IND STRL 7.5 (GLOVE) ×4 IMPLANT
GLOVE BIOGEL PI INDICATOR 7.0 (GLOVE) ×1
GLOVE BIOGEL PI INDICATOR 7.5 (GLOVE) ×4
GLOVE ECLIPSE 7.0 STRL STRAW (GLOVE) ×4 IMPLANT
GLOVE SKINSENSE NS SZ7.5 (GLOVE) ×1
GLOVE SKINSENSE STRL SZ7.5 (GLOVE) ×1 IMPLANT
GLOVE SURG UNDER POLY LF SZ7 (GLOVE) ×2 IMPLANT
GLOVE SURG UNDER POLY LF SZ7.5 (GLOVE) ×4 IMPLANT
GOWN STRL REIN XL XLG (GOWN DISPOSABLE) ×2 IMPLANT
GOWN STRL REUS W/ TWL LRG LVL3 (GOWN DISPOSABLE) IMPLANT
GOWN STRL REUS W/ TWL XL LVL3 (GOWN DISPOSABLE) ×1 IMPLANT
GOWN STRL REUS W/TWL LRG LVL3 (GOWN DISPOSABLE)
GOWN STRL REUS W/TWL XL LVL3 (GOWN DISPOSABLE) ×2
HANDPIECE INTERPULSE COAX TIP (DISPOSABLE) ×2
HIP BALL CERAMIC (Hips) ×2 IMPLANT
HOOD PEEL AWAY FLYTE STAYCOOL (MISCELLANEOUS) ×4 IMPLANT
IV NS IRRIG 3000ML ARTHROMATIC (IV SOLUTION) ×2 IMPLANT
JET LAVAGE IRRISEPT WOUND (IRRIGATION / IRRIGATOR) ×2
KIT BASIN OR (CUSTOM PROCEDURE TRAY) ×2 IMPLANT
LAVAGE JET IRRISEPT WOUND (IRRIGATION / IRRIGATOR) ×1 IMPLANT
MARKER SKIN DUAL TIP RULER LAB (MISCELLANEOUS) ×2 IMPLANT
NDL SPNL 18GX3.5 QUINCKE PK (NEEDLE) ×1 IMPLANT
NEEDLE SPNL 18GX3.5 QUINCKE PK (NEEDLE) ×2 IMPLANT
PACK TOTAL JOINT (CUSTOM PROCEDURE TRAY) ×2 IMPLANT
PACK UNIVERSAL I (CUSTOM PROCEDURE TRAY) ×2 IMPLANT
PINN ALTRX NEUT ID X OD 32X48 ×1 IMPLANT
PINNSECTOR W/GRIP ACE CUP 48MM (Joint) ×2 IMPLANT
RETRACTOR WND ALEXIS 18 MED (MISCELLANEOUS) IMPLANT
RTRCTR WOUND ALEXIS 18CM MED (MISCELLANEOUS)
SAW OSC TIP CART 19.5X105X1.3 (SAW) ×2 IMPLANT
SCREW 6.5MMX25MM (Screw) ×1 IMPLANT
SET HNDPC FAN SPRY TIP SCT (DISPOSABLE) ×1 IMPLANT
STAPLER VISISTAT 35W (STAPLE) IMPLANT
STEM FEM ACTIS STD SZ2 (Stem) ×1 IMPLANT
SUT ETHIBOND 2 V 37 (SUTURE) ×2 IMPLANT
SUT ETHILON 2 0 FS 18 (SUTURE) ×2 IMPLANT
SUT VIC AB 0 CT1 27 (SUTURE) ×2
SUT VIC AB 0 CT1 27XBRD ANBCTR (SUTURE) ×1 IMPLANT
SUT VIC AB 1 CTX 36 (SUTURE) ×2
SUT VIC AB 1 CTX36XBRD ANBCTR (SUTURE) ×1 IMPLANT
SUT VIC AB 2-0 CT1 27 (SUTURE) ×4
SUT VIC AB 2-0 CT1 TAPERPNT 27 (SUTURE) ×2 IMPLANT
SYR 50ML LL SCALE MARK (SYRINGE) ×2 IMPLANT
TOWEL GREEN STERILE (TOWEL DISPOSABLE) ×2 IMPLANT
TRAY CATH 16FR W/PLASTIC CATH (SET/KITS/TRAYS/PACK) IMPLANT
TRAY FOLEY W/BAG SLVR 16FR (SET/KITS/TRAYS/PACK) ×2
TRAY FOLEY W/BAG SLVR 16FR ST (SET/KITS/TRAYS/PACK) ×1 IMPLANT
YANKAUER SUCT BULB TIP NO VENT (SUCTIONS) ×2 IMPLANT

## 2022-07-16 NOTE — Progress Notes (Signed)
Pt. Arrived to unit alert and oriented no c/o pian. Bed in lowest position, call bell within reach

## 2022-07-16 NOTE — Plan of Care (Signed)

## 2022-07-16 NOTE — Anesthesia Procedure Notes (Addendum)
Spinal  Patient location during procedure: OR Start time: 07/16/2022 12:25 PM End time: 07/16/2022 12:32 PM Reason for block: surgical anesthesia Staffing Performed: anesthesiologist  Anesthesiologist: Suzette Battiest, MD Performed by: Suzette Battiest, MD Authorized by: Suzette Battiest, MD   Preanesthetic Checklist Completed: patient identified, IV checked, site marked, risks and benefits discussed, surgical consent, monitors and equipment checked, pre-op evaluation and timeout performed Spinal Block Patient position: sitting Prep: DuraPrep Patient monitoring: heart rate, cardiac monitor, continuous pulse ox and blood pressure Approach: right paramedian Location: L4-5 Injection technique: single-shot Needle Needle type: Quincke  Needle gauge: 22 G Needle length: 9 cm Assessment Sensory level: T4 Events: CSF return Additional Notes Difficult to place spinal. Attempted x 3 levels via midline approach without success. Switched to paramedian approach with 22G quincke with return of CSF. Aspiration before and after injection LA.

## 2022-07-16 NOTE — H&P (Signed)
PREOPERATIVE H&P  Chief Complaint: RIGHT HIP DEGENERATIVE JOINT DISEASE  HPI: Virginia Castillo is a 71 y.o. female who presents for surgical treatment of RIGHT HIP DEGENERATIVE JOINT DISEASE.  She denies any changes in medical history.  Past Medical History:  Diagnosis Date   Anxiety    Cancer [facility]) 2013   right breast   Depression    Ectopic pregnancy 1981   Family history of adverse reaction to anesthesia    daughter has PONV   Hypertension    Past Surgical History:  Procedure Laterality Date   ANKLE FRACTURE SURGERY Right    MASTECTOMY Right 2013   UNILATERAL SALPINGECTOMY  1981   due to ectopic pregnancy   Social History   Socioeconomic History   Marital status: Divorced    Spouse name: Not on file   Number of children: 2   Years of education: Not on file   Highest education level: Not on file  Occupational History   Not on file  Tobacco Use   Smoking status: Former    Types: Cigarettes    Quit date: 04/16/2003    Years since quitting: 19.2   Smokeless tobacco: Never  Vaping Use   Vaping Use: Never used  Substance and Sexual Activity   Alcohol use: Not Currently   Drug use: Not Currently   Sexual activity: Not on file  Other Topics Concern   Not on file  Social History Narrative   Not on file   Social Determinants of Health   Financial Resource Strain: Not on file  Food Insecurity: Not on file  Transportation Needs: Not on file  Physical Activity: Not on file  Stress: Not on file  Social Connections: Not on file   No family history on file. No Known Allergies Prior to Admission medications   Medication Sig Start Date End Date Taking? Authorizing Provider  amLODipine (NORVASC) 5 MG tablet Take 5 mg by mouth daily.   Yes [provider]  atorvastatin (LIPITOR) 40 MG tablet Take 40 mg by mouth daily.   Yes [provider]  clonazePAM (KLONOPIN) 0.5 MG tablet Take 0.5 mg by mouth daily as needed for anxiety.   Yes [provider]  diclofenac Sodium (VOLTAREN) 1 % GEL Apply 1 Application topically 4 (four) times daily as needed (pain).   Yes [provider]  ibuprofen (ADVIL) 600 MG tablet Take 600 mg by mouth every 6 (six) hours as needed for moderate pain.   Yes [provider]  latanoprost (XALATAN) 0.005 % ophthalmic solution Place 1 drop into the right eye at bedtime. 04/15/22  Yes [provider]  sertraline (ZOLOFT) 50 MG tablet Take 50 mg by mouth daily.   Yes [provider]  traZODone (DESYREL) 150 MG tablet Take 150 mg by mouth at bedtime.   Yes [provider]  aspirin EC 81 MG tablet Take 1 tablet (81 mg total) by mouth 2 (two) times daily as needed. Swallow whole. 07/11/22 07/11/23  Aundra Dubin, PA-C  docusate sodium (COLACE) 100 MG capsule Take 1 capsule (100 mg total) by mouth daily as needed. 07/11/22 07/11/23  Aundra Dubin, PA-C  methocarbamol (ROBAXIN) 500 MG tablet Take 1 tablet (500 mg total) by mouth 2 (two) times daily as needed. To be taken after surgery 07/11/22   Aundra Dubin, PA-C  ondansetron (ZOFRAN) 4 MG tablet Take 1 tablet (4 mg total) by mouth every 8 (eight) hours as needed for nausea or vomiting.  07/11/22   Aundra Dubin, PA-C  oxyCODONE-acetaminophen (PERCOCET) 5-325 MG tablet Take 1-2 tablets by mouth every 6 (six) hours as needed. To be taken after surgery 07/11/22   Aundra Dubin, PA-C     Positive ROS: All other systems have been reviewed and were otherwise negative with the exception of those mentioned in the HPI and as above.  Physical Exam: General: Alert, no acute distress Cardiovascular: No pedal edema Respiratory: No cyanosis, no use of accessory musculature GI: abdomen soft Skin: No lesions in the area of chief complaint Neurologic: Sensation intact distally Psychiatric: Patient is competent for consent with normal mood and affect Lymphatic: no lymphedema  MUSCULOSKELETAL: exam stable  Assessment: RIGHT  HIP DEGENERATIVE JOINT DISEASE  Plan: Plan for Procedure(s): RIGHT TOTAL HIP ARTHROPLASTY ANTERIOR APPROACH  The risks benefits and alternatives were discussed with the patient including but not limited to the risks of nonoperative treatment, versus surgical intervention including infection, bleeding, nerve injury,  blood clots, cardiopulmonary complications, morbidity, mortality, among others, and they were willing to proceed.   Eduard Roux, MD 07/16/2022 10:06 AM

## 2022-07-16 NOTE — Anesthesia Preprocedure Evaluation (Signed)
Anesthesia Evaluation  Patient identified by MRN, date of birth, ID band Patient awake    Reviewed: Allergy & Precautions, NPO status , Patient's Chart, lab work & pertinent test results  Airway Mallampati: II  TM Distance: >3 FB Neck ROM: Full    Dental  (+) Dental Advisory Given   Pulmonary former smoker,    breath sounds clear to auscultation       Cardiovascular hypertension, Pt. on medications  Rhythm:Regular Rate:Normal     Neuro/Psych negative neurological ROS     GI/Hepatic negative GI ROS, Neg liver ROS,   Endo/Other  negative endocrine ROS  Renal/GU negative Renal ROS     Musculoskeletal  (+) Arthritis ,   Abdominal   Peds  Hematology negative hematology ROS (+)   Anesthesia Other Findings   Reproductive/Obstetrics                             Lab Results  Component Value Date   WBC 6.1 07/08/2022   HGB 13.1 07/08/2022   HCT 41.6 07/08/2022   MCV 93.9 07/08/2022   PLT 232 07/08/2022   Lab Results  Component Value Date   CREATININE 0.64 07/08/2022   BUN 15 07/08/2022   NA 138 07/08/2022   K 3.5 07/08/2022   CL 102 07/08/2022   CO2 29 07/08/2022    Anesthesia Physical Anesthesia Plan  ASA: 2  Anesthesia Plan: Spinal   Post-op Pain Management: Ofirmev IV (intra-op)* and Toradol IV (intra-op)*   Induction:   PONV Risk Score and Plan: 2 and Dexamethasone, Ondansetron, Propofol infusion and Treatment may vary due to age or medical condition  Airway Management Planned: Natural Airway and Simple Face Mask  Additional Equipment:   Intra-op Plan:   Post-operative Plan:   Informed Consent: I have reviewed the patients History and Physical, chart, labs and discussed the procedure including the risks, benefits and alternatives for the proposed anesthesia with the patient or authorized representative who has indicated his/her understanding and acceptance.        Plan Discussed with:   Anesthesia Plan Comments:         Anesthesia Quick Evaluation

## 2022-07-16 NOTE — Transfer of Care (Signed)
Immediate Anesthesia Transfer of Care Note  Patient: Virginia Castillo  Procedure(s) Performed: RIGHT TOTAL HIP ARTHROPLASTY ANTERIOR APPROACH (Right: Hip)  Patient Location: PACU  Anesthesia Type:Spinal  Level of Consciousness: drowsy and patient cooperative  Airway & Oxygen Therapy: Patient Spontanous Breathing and Patient connected to nasal cannula oxygen  Post-op Assessment: Report given to RN and Post -op Vital signs reviewed and stable  Post vital signs: Reviewed and stable  Last Vitals:  Vitals Value Taken Time  BP 95/80 07/16/22 1411  Temp    Pulse 67 07/16/22 1413  Resp 19 07/16/22 1413  SpO2 98 % 07/16/22 1413  Vitals shown include unvalidated device data.  Last Pain:  Vitals:   07/16/22 1021  TempSrc:   PainSc: 0-No pain         Complications: No notable events documented.

## 2022-07-16 NOTE — Evaluation (Signed)
Physical Therapy Evaluation Patient Details Name: Virginia Castillo MRN: [id number] DOB: 1951-02-02 Today's Date: 07/16/2022  History of Present Illness  71 y.o. female presents to [facility] hospital on 07/16/2022 with R hip OA, for elective R THA. Pt underwent R THA with anterior approach on 8/9. PMH includes breast cancer, depression, anxiety, HTN.  Clinical Impression  Pt presents to PT with impairments in strength, balance, and gait secondary to R total hip arthroplasty. Pt is limited in therapy today by dizziness, lightheadedness, and nausea despite BP and other vitals remaining stable. Pt is able to stand bedside but defers ambulation and further transfers due to worsening lightheadedness and nausea. Continued therapy will assist the pt in building strength and balance for progression of mobility and independence.     Recommendations for follow up therapy are one component of a multi-disciplinary discharge planning process, led by the attending physician.  Recommendations may be updated based on patient status, additional functional criteria and insurance authorization.  Follow Up Recommendations Follow physician's recommendations for discharge plan and follow up therapies      Assistance Recommended at Discharge Frequent or constant Supervision/Assistance  Patient can return home with the following  A little help with walking and/or transfers;A lot of help with bathing/dressing/bathroom;Assistance with cooking/housework;Assist for transportation;Help with stairs or ramp for entrance    Equipment Recommendations Rolling walker (2 wheels)  Recommendations for Other Services       Functional Status Assessment Patient has had a recent decline in their functional status and demonstrates the ability to make significant improvements in function in a reasonable and predictable amount of time.     Precautions / Restrictions Precautions Precautions: Fall      Mobility  Bed Mobility Overal bed  mobility: Needs Assistance Bed Mobility: Supine to Sit, Sit to Supine     Supine to sit: Min guard (HHA for patient to pull; PT provides no addtional support) Sit to supine: Supervision        Transfers Overall transfer level: Needs assistance Equipment used: 1 person hand held assist Transfers: Sit to/from Stand Sit to Stand: Min guard (Pt pulling through PT hand-hold; PT provides no additional support)                Ambulation/Gait             Pre-gait activities: Marches in place    Financial trader Rankin (Stroke Patients Only)       Balance Overall balance assessment: Needs assistance Sitting-balance support: No upper extremity supported, Feet supported Sitting balance-Leahy Scale: Fair     Standing balance support: Bilateral upper extremity supported, During functional activity, Reliant on assistive device for balance Standing balance-Leahy Scale: Poor                               Pertinent Vitals/Pain Pain Assessment Pain Assessment: 0-10 Pain Score: 8  Pain Location: R hip Pain Descriptors / Indicators: Aching, Grimacing Pain Intervention(s): Monitored during session    Home Living Family/patient expects to be discharged to:: Private residence Living Arrangements: Children (Daughter with autism; unsure if daughter will be able to assist her physically but reports she is high functioning) Available Help at Discharge: Family Type of Home: Apartment Home Access: Level entry       Home Layout: One level Home Equipment: Shower seat;Cane - single point  Prior Function Prior Level of Function : Independent/Modified Independent;Driving;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)             Mobility Comments: Ind baseline with SPC use PRN ADLs Comments: Ind     Hand Dominance        Extremity/Trunk Assessment   Upper Extremity Assessment Upper Extremity  Assessment: Overall WFL for tasks assessed    Lower Extremity Assessment Lower Extremity Assessment: Generalized weakness    Cervical / Trunk Assessment Cervical / Trunk Assessment: Normal  Communication   Communication: No difficulties  Cognition Arousal/Alertness: Awake/alert Behavior During Therapy: WFL for tasks assessed/performed Overall Cognitive Status: Within Functional Limits for tasks assessed                                          General Comments General comments (skin integrity, edema, etc.): VSS on RA; pt c/o dizziness, nausea, and lightheadness throughout session, however, BP and other vitals remain stable    Exercises     Assessment/Plan    PT Assessment Patient needs continued PT services  PT Problem List Decreased strength;Decreased balance;Decreased mobility;Pain       PT Treatment Interventions DME instruction;Gait training;Stair training;Functional mobility training;Therapeutic activities;Therapeutic exercise;Balance training;Patient/family education    PT Goals (Current goals can be found in the Care Plan section)  Acute Rehab PT Goals Patient Stated Goal: Move without pain PT Goal Formulation: With patient Time For Goal Achievement: 07/30/22 Potential to Achieve Goals: Good    Frequency 7X/week     Co-evaluation               AM-PAC PT "6 Clicks" Mobility  Outcome Measure Help needed turning from your back to your side while in a flat bed without using bedrails?: A Little Help needed moving from lying on your back to sitting on the side of a flat bed without using bedrails?: A Little Help needed moving to and from a bed to a chair (including a wheelchair)?: A Little Help needed standing up from a chair using your arms (e.g., wheelchair or bedside chair)?: A Little Help needed to walk in hospital room?: A Little Help needed climbing 3-5 steps with a railing? : A Lot 6 Click Score: 17    End of Session Equipment  Utilized During Treatment: Gait belt Activity Tolerance: Other (comment) (Pt limited by dizziness, lightheadedness, and nausea) Patient left: in bed;with call bell/phone within reach;Other (comment) (With transport) Nurse Communication: Mobility status PT Visit Diagnosis: Unsteadiness on feet (R26.81);Muscle weakness (generalized) (M62.81);Difficulty in walking, not elsewhere classified (R26.2);Pain Pain - Right/Left: Right Pain - part of body: Hip    Time: 1657-1730 PT Time Calculation (min) (ACUTE ONLY): 33 min   Charges:   PT Evaluation $PT Eval Low Complexity: 1 Low          Hall Busing, SPT Acute Rehabilitation Office #: [phone]   Hall Busing 07/16/2022, 5:53 PM

## 2022-07-16 NOTE — Discharge Instructions (Signed)

## 2022-07-16 NOTE — Anesthesia Procedure Notes (Signed)
Procedure Name: MAC Date/Time: 07/16/2022 12:35 PM  Performed by: Colin Benton, CRNAPre-anesthesia Checklist: Patient identified and Emergency Drugs available Patient Re-evaluated:Patient Re-evaluated prior to induction Oxygen Delivery Method: Nasal cannula Induction Type: IV induction Placement Confirmation: positive ETCO2 Dental Injury: Teeth and Oropharynx as per pre-operative assessment

## 2022-07-16 NOTE — Op Note (Signed)
RIGHT TOTAL HIP ARTHROPLASTY ANTERIOR APPROACH  Procedure Note Megin Consalvo   [id number]  Pre-op Diagnosis: RIGHT HIP DEGENERATIVE JOINT DISEASE     Post-op Diagnosis: same  Operative Findings Synovitis and joint effusion Complete loss of cartilage from femoral head and acetabulum   Operative Procedures  1. Total hip replacement; Right hip; uncemented [id number]   Surgeon: Frankey Shown, M.D.  Assist: Madalyn Rob, PA-C   Anesthesia: spinal  Prosthesis: Depuy Acetabulum: Pinnacle 48 mm Femur: Actis 2 STD Head: 32 mm size: +5 Liner: +4 Bearing Type: ceramic/poly  Total Hip Arthroplasty (Anterior Approach) Op Note:  After informed consent was obtained and the operative extremity marked in the holding area, the patient was brought back to the operating room and placed supine on the HANA table. Next, the operative extremity was prepped and draped in normal sterile fashion. Surgical timeout occurred verifying patient identification, surgical site, surgical procedure and administration of antibiotics.  A modified anterior Reily-Peterson approach to the hip was performed, using the interval between tensor fascia lata and sartorius.  Dissection was carried bluntly down onto the anterior hip capsule. The lateral femoral circumflex vessels were identified and coagulated. A capsulotomy was performed and the capsular flaps tagged for later repair.  The neck osteotomy was performed. The femoral head was removed which showed severe wear, the acetabular rim was cleared of soft tissue and attention was turned to reaming the acetabulum.  Sequential reaming was performed under fluoroscopic guidance. We reamed to a size 47 mm, and then impacted the acetabular shell. A 25 mm cancellous screw was placed through the shell for added fixation.  The liner was then placed after irrigation and attention turned to the femur.  After placing the femoral hook, the leg was taken to externally rotated, extended  and adducted position taking care to perform soft tissue releases to allow for adequate mobilization of the femur. Soft tissue was cleared from the shoulder of the greater trochanter and the hook elevator used to improve exposure of the proximal femur. Sequential broaching performed up to a size 2. Trial neck and head were placed. The leg was brought back up to neutral and the construct reduced.  Antibiotic irrigation was placed in the surgical wound.  The position and sizing of components, offset and leg lengths were checked using fluoroscopy. Stability of the construct was checked in extension and external rotation without any subluxation or impingement of prosthesis. We dislocated the prosthesis, dropped the leg back into position, removed trial components, and irrigated copiously. The final stem and head was then placed, the leg brought back up, the system reduced and fluoroscopy used to verify positioning.  We irrigated, obtained hemostasis and closed the capsule using #2 ethibond suture.  One gram of vancomycin powder was placed in the surgical bed.   One gram of topical tranexamic acid was injected into the joint.  The fascia was closed with #1 vicryl plus, the deep fat layer was closed with 0 vicryl, the subcutaneous layers closed with 2.0 Vicryl Plus and the skin closed with 2.0 nylon and dermabond. A sterile dressing was applied. The patient was awakened in the operating room and taken to recovery in stable condition.  All sponge, needle, and instrument counts were correct at the end of the case.   Tawanna Cooler, my PA, was a medical necessity for opening, closing, limb positioning, retracting, exposing, and overall facilitation and timely completion of the surgery.  Position: supine  Complications: see description of procedure.  Time Out:  performed   Drains/Packing: none  Estimated blood loss: see anesthesia record  Returned to Recovery Room: in good condition.   Antibiotics: yes    Mechanical VTE (DVT) Prophylaxis: sequential compression devices, TED thigh-high  Chemical VTE (DVT) Prophylaxis: aspirin   Fluid Replacement: see anesthesia record  Specimens Removed: 1 to pathology   Sponge and Instrument Count Correct? yes   PACU: portable radiograph - low AP   Plan/RTC: Return in 2 weeks for staple removal. Weight Bearing/Load Lower Extremity: full  Hip precautions: none Suture Removal: 2 weeks   N. Eduard Roux, MD [facility] 1:44 PM   Implant Name Type Inv. Item Serial No. Manufacturer Lot No. LRB No. Used Action  PINNSECTOR W/GRIP ACE CUP 48MM - [id number] Joint PINNSECTOR W/GRIP ACE CUP 48MM  [employer] [id number] Right 1 Implanted  SCREW 6.5MMX25MM - [id number] Screw SCREW 6.5MMX25MM  [employer] [id number] Right 1 Implanted  PINN ALTRX NEUT ID X OD 32X48 - [id number]  PINN ALTRX NEUT ID X OD 32X48  [employer] [id number] Right 1 Implanted  STEM FEM ACTIS STD SZ2 - [id number] Stem STEM FEM ACTIS STD SZ2  [employer] [id number] Right 1 Implanted

## 2022-07-17 ENCOUNTER — Encounter (HOSPITAL_COMMUNITY): Payer: Self-pay | Admitting: Orthopaedic Surgery

## 2022-07-17 ENCOUNTER — Telehealth: Payer: Self-pay | Admitting: Orthopaedic Surgery

## 2022-07-17 DIAGNOSIS — M1611 Unilateral primary osteoarthritis, right hip: Secondary | ICD-10-CM | POA: Diagnosis not present

## 2022-07-17 LAB — CBC
HCT: 32.1 % — ABNORMAL LOW (ref 36.0–46.0)
Hemoglobin: 10.5 g/dL — ABNORMAL LOW (ref 12.0–15.0)
MCH: 29.7 pg (ref 26.0–34.0)
MCHC: 32.7 g/dL (ref 30.0–36.0)
MCV: 90.7 fL (ref 80.0–100.0)
Platelets: 186 10*3/uL (ref 150–400)
RBC: 3.54 MIL/uL — ABNORMAL LOW (ref 3.87–5.11)
RDW: 12.5 % (ref 11.5–15.5)
WBC: 7 10*3/uL (ref 4.0–10.5)
nRBC: 0 % (ref 0.0–0.2)

## 2022-07-17 NOTE — Progress Notes (Signed)
Physical Therapy Treatment Patient Details Name: Virginia Castillo MRN: [id number] DOB: January 26, 1951 Today's Date: 07/17/2022   History of Present Illness 71 y.o. female presents to [facility] hospital on 07/16/2022 with R hip OA, for elective R THA. Pt underwent R THA with anterior approach on 8/9. PMH includes breast cancer, depression, anxiety, HTN.    PT Comments    Pt instructed in and completed gait training and therapeutic activities. Pt able to ambulate 40 feet with RW. Pt requiring min to moderate assistance for bed mobility and transfers. Pt will need at least additional physical therapy session this afternoon prior to discharge.     Recommendations for follow up therapy are one component of a multi-disciplinary discharge planning process, led by the attending physician.  Recommendations may be updated based on patient status, additional functional criteria and insurance authorization.  Follow Up Recommendations  Follow physician's recommendations for discharge plan and follow up therapies     Assistance Recommended at Discharge Frequent or constant Supervision/Assistance  Patient can return home with the following A little help with walking and/or transfers;A lot of help with bathing/dressing/bathroom;Assistance with cooking/housework;Assist for transportation;Help with stairs or ramp for entrance   Equipment Recommendations  Rolling walker (2 wheels);BSC/3in1    Recommendations for Other Services       Precautions / Restrictions Precautions Precautions: Fall Restrictions Weight Bearing Restrictions: Yes RLE Weight Bearing: Weight bearing as tolerated     Mobility  Bed Mobility Overal bed mobility: Needs Assistance Bed Mobility: Supine to Sit, Sit to Supine     Supine to sit: Min assist Sit to supine: Mod assist   General bed mobility comments: Bed rail and cues required throughout. Pt needing assistance for LE management for return to bed.    Transfers Overall transfer level:  Needs assistance Equipment used: Rolling walker (2 wheels) Transfers: Sit to/from Stand Sit to Stand: Min assist           General transfer comment: Pt required cues for hand placements and technique. Decreased muscle power present.    Ambulation/Gait Ambulation/Gait assistance: Min guard Gait Distance (Feet): 40 Feet Assistive device: Rolling walker (2 wheels) Gait Pattern/deviations: Step-to pattern, Decreased step length - right, Decreased step length - left, Antalgic Gait velocity: decreased Gait velocity interpretation: <1.31 ft/sec, indicative of household ambulator   General Gait Details: Pt instructed in and performed modified 3 point pattern. Slow pace and decreased endurance. Pt with UE fatigue.   Stairs             Wheelchair Mobility    Modified Rankin (Stroke Patients Only)       Balance Overall balance assessment: Needs assistance Sitting-balance support: No upper extremity supported, Feet supported Sitting balance-Leahy Scale: Fair     Standing balance support: Bilateral upper extremity supported, During functional activity, Reliant on assistive device for balance Standing balance-Leahy Scale: Poor                              Cognition Arousal/Alertness: Awake/alert Behavior During Therapy: WFL for tasks assessed/performed Overall Cognitive Status: Within Functional Limits for tasks assessed (for basic mobility tasks)                                 General Comments: Pt with grogginess but able to follow simple commands appropriately. Slow processing at times.        Exercises  General Comments General comments (skin integrity, edema, etc.): HR and SpO2 stable on RA      Pertinent Vitals/Pain Pain Assessment Pain Assessment: 0-10 Pain Score: 0-No pain (at rest; DNQ thereafter) Pain Location: R hip Pain Descriptors / Indicators: Aching, Grimacing Pain Intervention(s): Limited activity within patient's  tolerance, Monitored during session    Home Living                          Prior Function            PT Goals (current goals can now be found in the care plan section) Acute Rehab PT Goals Patient Stated Goal: Move without pain PT Goal Formulation: With patient Time For Goal Achievement: 07/30/22 Potential to Achieve Goals: Good Progress towards PT goals: Progressing toward goals    Frequency    7X/week      PT Plan Current plan remains appropriate    Co-evaluation              AM-PAC PT "6 Clicks" Mobility   Outcome Measure  Help needed turning from your back to your side while in a flat bed without using bedrails?: A Little Help needed moving from lying on your back to sitting on the side of a flat bed without using bedrails?: A Little Help needed moving to and from a bed to a chair (including a wheelchair)?: A Little Help needed standing up from a chair using your arms (e.g., wheelchair or bedside chair)?: A Little Help needed to walk in hospital room?: A Little Help needed climbing 3-5 steps with a railing? : A Lot 6 Click Score: 17    End of Session Equipment Utilized During Treatment: Gait belt Activity Tolerance: Other (comment);Patient tolerated treatment well (Minimal dizziness present but stable) Patient left: in bed;with call bell/phone within reach;with bed alarm set Nurse Communication: Mobility status PT Visit Diagnosis: Unsteadiness on feet (R26.81);Muscle weakness (generalized) (M62.81);Difficulty in walking, not elsewhere classified (R26.2);Pain Pain - Right/Left: Right Pain - part of body: Hip     Time: 2353-6144 PT Time Calculation (min) (ACUTE ONLY): 25 min  Charges:  $Gait Training: 8-22 mins $Therapeutic Activity: 8-22 mins                     Donna Bernard, PT    [facility] 07/17/2022, 10:32 AM

## 2022-07-17 NOTE — Discharge Summary (Addendum)
Patient ID: Virginia Castillo MRN: [id number] DOB/AGE: [birth year] 71 y.o.  Admit date: 07/16/2022 Discharge date: 07/18/2022  Admission Diagnoses:  Principal Problem:   Primary osteoarthritis of right hip Active Problems:   Status post total hip replacement, right   Discharge Diagnoses:  Same  Past Medical History:  Diagnosis Date   Anxiety    Cancer [facility]) 2013   right breast   Depression    Ectopic pregnancy 1981   Family history of adverse reaction to anesthesia    daughter has PONV   Hypertension     Surgeries: Procedure(s): RIGHT TOTAL HIP ARTHROPLASTY ANTERIOR APPROACH on 07/16/2022   Consultants:   Discharged Condition: Improved  Hospital Course: Virginia Castillo is an 71 y.o. female who was admitted 07/16/2022 for operative treatment ofPrimary osteoarthritis of right hip. Patient has severe unremitting pain that affects sleep, daily activities, and work/hobbies. After pre-op clearance the patient was taken to the operating room on 07/16/2022 and underwent  Procedure(s): RIGHT TOTAL HIP ARTHROPLASTY ANTERIOR APPROACH.    Patient was given perioperative antibiotics:  Anti-infectives (From admission, onward)    Start     Dose/Rate Route Frequency Ordered Stop   07/16/22 1611  ceFAZolin (ANCEF) 2-4 GM/100ML-% IVPB       Note to Pharmacy: Jake Bathe M: cabinet override      07/16/22 1611 07/17/22 0414   07/16/22 1545  ceFAZolin (ANCEF) IVPB 2g/100 mL premix        2 g 200 mL/hr over 30 Minutes Intravenous Every 6 hours 07/16/22 1535 07/16/22 2147   07/16/22 1332  vancomycin (VANCOCIN) powder  Status:  Discontinued          As needed 07/16/22 1347 07/16/22 1407   07/16/22 1100  ceFAZolin (ANCEF) IVPB 2g/100 mL premix        2 g 200 mL/hr over 30 Minutes Intravenous On call to O.R. 07/16/22 0947 07/16/22 1256   07/16/22 0956  ceFAZolin (ANCEF) 2-4 GM/100ML-% IVPB       Note to Pharmacy: Humberto Leep O: cabinet override      07/16/22 0956 07/16/22 1226        Patient was  given sequential compression devices, early ambulation, and chemoprophylaxis to prevent DVT.  Patient benefited maximally from hospital stay and there were no complications.    Recent vital signs: Patient Vitals for the past 24 hrs:  BP Temp Temp src Pulse Resp SpO2  07/17/22 2123 (!) 146/58 98 F (36.7 C) Oral 77 18 91 %  07/17/22 2100 -- 98 F (36.7 C) -- -- -- --  07/17/22 0931 (!) 123/49 98.9 F (37.2 C) -- 85 19 90 %     Recent laboratory studies:  Recent Labs    07/17/22 0219  WBC 7.0  HGB 10.5*  HCT 32.1*  PLT 186     Discharge Medications:   Allergies as of 07/18/2022   No Known Allergies      Medication List     STOP taking these medications    ibuprofen 600 MG tablet Commonly known as: ADVIL   Voltaren 1 % Gel Generic drug: diclofenac Sodium       TAKE these medications    amLODipine 5 MG tablet Commonly known as: NORVASC Take 5 mg by mouth daily.   amoxicillin-clavulanate 875-125 MG tablet Commonly known as: AUGMENTIN Take 1 tablet by mouth 2 (two) times daily. Please start taking when finished with previous augmentin prescription   aspirin EC 81 MG tablet Take 1 tablet (81 mg total) by  mouth 2 (two) times daily. Swallow whole. What changed:  when to take this reasons to take this   atorvastatin 40 MG tablet Commonly known as: LIPITOR Take 40 mg by mouth daily.   clonazePAM 0.5 MG tablet Commonly known as: KLONOPIN Take 0.5 mg by mouth daily as needed for anxiety.   docusate sodium 100 MG capsule Commonly known as: Colace Take 1 capsule (100 mg total) by mouth daily as needed.   latanoprost 0.005 % ophthalmic solution Commonly known as: XALATAN Place 1 drop into the right eye at bedtime.   methocarbamol 500 MG tablet Commonly known as: ROBAXIN Take 1 tablet (500 mg total) by mouth 2 (two) times daily as needed. To be taken after surgery   ondansetron 4 MG tablet Commonly known as: Zofran Take 1 tablet (4 mg total) by mouth  every 8 (eight) hours as needed for nausea or vomiting.   oxyCODONE-acetaminophen 5-325 MG tablet Commonly known as: Percocet Take 1-2 tablets by mouth every 6 (six) hours as needed. To be taken after surgery   sertraline 50 MG tablet Commonly known as: ZOLOFT Take 50 mg by mouth daily.   traZODone 150 MG tablet Commonly known as: DESYREL Take 150 mg by mouth at bedtime.               Durable Medical Equipment  (From admission, onward)           Start     Ordered   07/17/22 1105  For home use only DME 3 n 1  Once        07/17/22 1105   07/17/22 1105  For home use only DME Walker rolling  Once       Question Answer Comment  Walker: With [place] Wheels   Patient needs a walker to treat with the following condition Gait instability      07/17/22 1105   07/16/22 1537  DME Walker rolling  Once       Question:  Patient needs a walker to treat with the following condition  Answer:  History of hip replacement   07/16/22 1536   07/16/22 1537  DME 3 n 1  Once        07/16/22 1536   07/16/22 1537  DME Bedside commode  Once       Question:  Patient needs a bedside commode to treat with the following condition  Answer:  History of hip replacement   07/16/22 1536            Diagnostic Studies: DG Pelvis Portable  Result Date: 07/16/2022 CLINICAL DATA:  Status post right hip arthroplasty. Anterior approach. EXAM: PORTABLE PELVIS 1-2 VIEWS COMPARISON:  Pelvis and right hip radiographs 06/12/2022 FINDINGS: Interval total right hip arthroplasty. No perihardware lucency is seen to indicate hardware failure or loosening. Expected postoperative changes including subcutaneous air and soft tissue swelling about the lateral greater than medial aspect of the right hip. Severe superomedial left femoroacetabular joint space narrowing and moderate left femoral head-neck junction degenerative osteophytes are unchanged. Mild bilateral sacroiliac and mild pubic symphysis osteoarthritis.  IMPRESSION: Interval total right hip arthroplasty without evidence of hardware failure. Electronically Signed   By: Yvonne Kendall M.D.   On: 07/16/2022 14:54   DG HIP UNILAT WITH PELVIS 1V RIGHT  Result Date: 07/16/2022 CLINICAL DATA:  Fluoroscopic assistance for right hip arthroplasty EXAM: DG HIP (WITH OR WITHOUT PELVIS) 1V RIGHT COMPARISON:  06/12/2022 FINDINGS: Fluoroscopic images show right hip arthroplasty. In the previous study,  Severe degenerative changes were noted in right hip. Fluoroscopy time 18.3 seconds. Radiation dose 0.78 mGy. IMPRESSION: Fluoroscopic assistance was provided for right hip arthroplasty. Electronically Signed   By: Elmer Picker M.D.   On: 07/16/2022 13:53   DG C-Arm 1-60 Min-No Report  Result Date: 07/16/2022 Fluoroscopy was utilized by the requesting physician.  No radiographic interpretation.   DG C-Arm 1-60 Min-No Report  Result Date: 07/16/2022 Fluoroscopy was utilized by the requesting physician.  No radiographic interpretation.    Disposition: Discharge disposition: 01-Home or Self Care          Follow-up Information     Leandrew Koyanagi, MD. Schedule an appointment as soon as possible for a visit in 2 week(s).   Specialty: Orthopedic Surgery Contact information: [place] [address] [phone]         [facility] Follow up.   Specialty: Home Health Services Why: Someone will call you to schedule first home visit. Contact information: [address] [place]  [address] [phone]                  Signed: Aundra Dubin 07/18/2022, 8:33 AM

## 2022-07-17 NOTE — Progress Notes (Signed)
Subjective: 1 Day Post-Op Procedure(s) (LRB): RIGHT TOTAL HIP ARTHROPLASTY ANTERIOR APPROACH (Right) Patient reports pain as mild.    Objective: Vital signs in last 24 hours: Temp:  [97.6 F (36.4 C)-98 F (36.7 C)] 98 F (36.7 C) (08/09 2028) Pulse Rate:  [49-85] 85 (08/10 0418) Resp:  [13-23] 17 (08/09 2028) BP: (104-155)/(50-87) 130/53 (08/10 0418) SpO2:  [91 %-100 %] 91 % (08/10 0418) Weight:  [61.2 kg] 61.2 kg (08/09 0954)  Intake/Output from previous day: 08/09 0701 - 08/10 0700 In: 1511.1 [I.V.:1000.5; IV Piggyback:510.6] Out: 1500 [Urine:1150; Blood:350] Intake/Output this shift: No intake/output data recorded.  Recent Labs    07/17/22 0219  HGB 10.5*   Recent Labs    07/17/22 0219  WBC 7.0  RBC 3.54*  HCT 32.1*  PLT 186   No results for input(s): "NA", "K", "CL", "CO2", "BUN", "CREATININE", "GLUCOSE", "CALCIUM" in the last 72 hours. No results for input(s): "LABPT", "INR" in the last 72 hours.  Neurologically intact Neurovascular intact Sensation intact distally Intact pulses distally Dorsiflexion/Plantar flexion intact Incision: dressing C/D/I No cellulitis present Compartment soft   Assessment/Plan: 1 Day Post-Op Procedure(s) (LRB): RIGHT TOTAL HIP ARTHROPLASTY ANTERIOR APPROACH (Right) Advance diet Up with therapy D/C IV fluids D/c home after second PT session as long as cleared by PT.  Patient states does not have stairs at home WBAT RLE ABLA- mild and stable      Aundra Dubin 07/17/2022, 8:16 AM

## 2022-07-17 NOTE — Care Management Obs Status (Signed)
[facility] NOTIFICATION   Patient Details  Name: Virginia Castillo MRN: [id number] Date of Birth: 09-May-1951   Medicare Observation Status Notification Given:  Yes    Tom-Johnson, Renea Ee, RN 07/17/2022, 10:33 AM

## 2022-07-17 NOTE — Progress Notes (Signed)
Physical Therapy Treatment Patient Details Name: Virginia Castillo MRN: [id number] DOB: November 25, 1951 Today's Date: 07/17/2022   History of Present Illness 71 y.o. female presents to [facility] hospital on 07/16/2022 with R hip OA, for elective R THA. Pt underwent R THA with anterior approach on 8/9. PMH includes breast cancer, depression, anxiety, HTN.    PT Comments    Pt instructed in and completed gait training. Pt able to increase distance to 275 feet with RW. Pt requesting to stay another night at hospital due to having more support tomorrow; nurse aware to notify MD. Pt cleared from physical therapy standpoint but to be seen once in AM if still here.    Recommendations for follow up therapy are one component of a multi-disciplinary discharge planning process, led by the attending physician.  Recommendations may be updated based on patient status, additional functional criteria and insurance authorization.  Follow Up Recommendations  Follow physician's recommendations for discharge plan and follow up therapies     Assistance Recommended at Discharge Frequent or constant Supervision/Assistance  Patient can return home with the following A little help with walking and/or transfers;A lot of help with bathing/dressing/bathroom;Assistance with cooking/housework;Assist for transportation;Help with stairs or ramp for entrance   Equipment Recommendations  Rolling walker (2 wheels);BSC/3in1    Recommendations for Other Services       Precautions / Restrictions Precautions Precautions: Fall Restrictions Weight Bearing Restrictions: Yes RLE Weight Bearing: Weight bearing as tolerated     Mobility  Bed Mobility Overal bed mobility: Needs Assistance Bed Mobility: Sit to Supine     Supine to sit: Min assist Sit to supine: Min assist   General bed mobility comments: Pt sitting EOB upon arrival (pt did on her own earlier as bed alarm was set off). Min assist for LE management returning to bed.     Transfers Overall transfer level: Needs assistance Equipment used: Rolling walker (2 wheels) Transfers: Sit to/from Stand Sit to Stand: Supervision           General transfer comment: Pt required cues for hand placements and technique.    Ambulation/Gait Ambulation/Gait assistance: Min guard Gait Distance (Feet): 275 Feet Assistive device: Rolling walker (2 wheels) Gait Pattern/deviations: Step-to pattern, Decreased step length - right, Decreased step length - left, Antalgic Gait velocity: decreased Gait velocity interpretation: 1.31 - 2.62 ft/sec, indicative of limited community ambulator   General Gait Details: Pt instructed in and performed modified 3 point pattern. Pt able to increase distance well. No LOB occurred. Slow pace.   Stairs             Wheelchair Mobility    Modified Rankin (Stroke Patients Only)       Balance Overall balance assessment: Needs assistance Sitting-balance support: No upper extremity supported, Feet supported Sitting balance-Leahy Scale: Fair     Standing balance support: Bilateral upper extremity supported, Reliant on assistive device for balance, No upper extremity supported Standing balance-Leahy Scale: Fair                              Cognition Arousal/Alertness: Awake/alert Behavior During Therapy: WFL for tasks assessed/performed Overall Cognitive Status: Within Functional Limits for tasks assessed                                 General Comments: Pt following commands appropriately although decreased safety awareness present at times.  Exercises      General Comments General comments (skin integrity, edema, etc.): HR and SpO2 stable on RA      Pertinent Vitals/Pain Pain Assessment Pain Assessment: 0-10 Pain Score: 7  Pain Location: R hip Pain Descriptors / Indicators: Aching, Grimacing Pain Intervention(s): Limited activity within patient's tolerance, Monitored during  session    Home Living                          Prior Function            PT Goals (current goals can now be found in the care plan section) Acute Rehab PT Goals Patient Stated Goal: Move without pain PT Goal Formulation: With patient Time For Goal Achievement: 07/30/22 Potential to Achieve Goals: Good Progress towards PT goals: Progressing toward goals    Frequency    7X/week      PT Plan Current plan remains appropriate    Co-evaluation              AM-PAC PT "6 Clicks" Mobility   Outcome Measure  Help needed turning from your back to your side while in a flat bed without using bedrails?: None Help needed moving from lying on your back to sitting on the side of a flat bed without using bedrails?: None Help needed moving to and from a bed to a chair (including a wheelchair)?: A Little Help needed standing up from a chair using your arms (e.g., wheelchair or bedside chair)?: A Little Help needed to walk in hospital room?: A Little Help needed climbing 3-5 steps with a railing? : A Little 6 Click Score: 20    End of Session Equipment Utilized During Treatment: Gait belt Activity Tolerance: Patient tolerated treatment well;Patient limited by pain Patient left: in bed;with call bell/phone within reach;with bed alarm set Nurse Communication: Mobility status (discharge status) PT Visit Diagnosis: Unsteadiness on feet (R26.81);Muscle weakness (generalized) (M62.81);Difficulty in walking, not elsewhere classified (R26.2);Pain Pain - Right/Left: Right Pain - part of body: Hip     Time: 1510-1530 PT Time Calculation (min) (ACUTE ONLY): 20 min  Charges:  $Gait Training: 8-22 mins                    Donna Bernard, PT    [facility] 07/17/2022, 3:42 PM

## 2022-07-17 NOTE — TOC Transition Note (Signed)
Transition of Care [facility]) - CM/SW Discharge Note   Patient Details  Name: Virginia Castillo MRN: [id number] Date of Birth: [birth year]  Transition of Care [facility]) CM/SW Contact:  Tom-Johnson, Renea Ee, RN Phone Number: 07/17/2022, 12:06 PM   Clinical Narrative:     Patient is scheduled for discharge today. Home health referral already arranged from MD's office with [facility],info on [facility]. BSC and RW ordered from [employer] and delivered to patient art bedside.  Patient states she lives at home with her high functioning Autistic daughter. Has a son but are not in good terms. Currently employed as a Hydrologist. Independent with care prior to admission. PCP is Sueanne Margarita, DO and uses [facility] on [address].   [facility] to transport at discharge. No Further TOC needs noted.   Final next level of care: [facility] Barriers to Discharge: Barriers Resolved   Patient Goals and CMS Choice Patient states their goals for this hospitalization and ongoing recovery are:: To return home CMS Medicare.gov Compare Post Acute Care list provided to:: Patient Choice offered to / list presented to : Patient  Discharge Placement PASRR number recieved: 07/17/22              Patient to be transferred to facility by: [facility]                DME Arranged: 3-N-1, Gilford Rile rolling DME Agency: [facility] Date DME Agency Contacted: 07/17/22 Time DME Agency Contacted: 1761 Representative spoke with at DME Agency: Jodell Cipro HH Arranged: PT, OT [place] Agency: [place]: [place] Representative spoke with at [place]: Claiborne Billings  Social Determinants of Health ([place]) Interventions     Readmission Risk Interventions     No data to display

## 2022-07-17 NOTE — Progress Notes (Signed)
Pt refused D/C today stated " I will have more support tomorrow 07/18/2022 so I need to stay another night." Notified PA Gevena Cotton she going to change order for tomorrow.

## 2022-07-17 NOTE — Anesthesia Postprocedure Evaluation (Signed)
Anesthesia Post Note  Patient: Virginia Castillo  Procedure(s) Performed: RIGHT TOTAL HIP ARTHROPLASTY ANTERIOR APPROACH (Right: Hip)     Patient location during evaluation: PACU Anesthesia Type: Spinal Level of consciousness: awake and alert Pain management: pain level controlled Vital Signs Assessment: post-procedure vital signs reviewed and stable Respiratory status: spontaneous breathing and respiratory function stable Cardiovascular status: blood pressure returned to baseline and stable Postop Assessment: spinal receding Anesthetic complications: no   No notable events documented.  Last Vitals:  Vitals:   07/17/22 0715 07/17/22 0931  BP:  (!) 123/49  Pulse:  85  Resp:  19  Temp:  37.2 C  SpO2: 90% 90%    Last Pain:  Vitals:   07/17/22 0715  TempSrc:   PainSc: 0-No pain                 Tiajuana Amass

## 2022-07-17 NOTE — Telephone Encounter (Signed)
Patient called advised she was suppose to released from the hospital tomorrow and she hasn't heard anything. Patient said no one communication with her this afternoon. Patient said she has repeatedly asked for a blanket and another hospital grown and no one has come to see about her. Patient said she need to make arrangement's to go home and need a discharge time. The number to contact patient is [phone]

## 2022-07-18 ENCOUNTER — Other Ambulatory Visit: Payer: Self-pay | Admitting: Physician Assistant

## 2022-07-18 DIAGNOSIS — M1611 Unilateral primary osteoarthritis, right hip: Secondary | ICD-10-CM | POA: Diagnosis not present

## 2022-07-18 MED ORDER — ASPIRIN 81 MG PO TBEC: 81.0000 mg | DELAYED_RELEASE_TABLET | Freq: Two times a day (BID) | ORAL | 0 refills | Status: AC

## 2022-07-18 NOTE — Telephone Encounter (Signed)
I spoke to this patient yesterday, the nurse last night and the patient and nurse again this morning when I was at the hospital.  She is d/c at 10 today.  She is aware

## 2022-07-18 NOTE — Progress Notes (Signed)
Physical Therapy Treatment Patient Details Name: Virginia Castillo MRN: [id number] DOB: 03-14-1951 Today's Date: 07/18/2022   History of Present Illness 71 y.o. female presents to [facility] hospital on 07/16/2022 with R hip OA, for elective R THA. Pt underwent R THA with anterior approach on 8/9. PMH includes breast cancer, depression, anxiety, HTN.    PT Comments    Pt continues to mobilize well. Mod I bed mobility and transfers. Supervision ambulation 250' with RW. Glide caps placed on pt's home RW. She is now declining [facility] stating the opening is not big enough for her to "take care of business." Plan is for d/c home today with HHPT.    Recommendations for follow up therapy are one component of a multi-disciplinary discharge planning process, led by the attending physician.  Recommendations may be updated based on patient status, additional functional criteria and insurance authorization.  Follow Up Recommendations  Follow physician's recommendations for discharge plan and follow up therapies     Assistance Recommended at Discharge PRN  Patient can return home with the following A little help with walking and/or transfers;A lot of help with bathing/dressing/bathroom;Assistance with cooking/housework;Assist for transportation;Help with stairs or ramp for entrance   Equipment Recommendations  Rolling walker (2 wheels) (Pt declining [facility])    Recommendations for Other Services       Precautions / Restrictions Precautions Precautions: Fall Restrictions RLE Weight Bearing: Weight bearing as tolerated     Mobility  Bed Mobility               General bed mobility comments: Pt up in room on arrival.    Transfers Overall transfer level: Modified independent Equipment used: Rolling walker (2 wheels)                    Ambulation/Gait Ambulation/Gait assistance: Supervision Gait Distance (Feet): 250 Feet Assistive device: Rolling walker (2 wheels) Gait Pattern/deviations:  Step-through pattern Gait velocity: decreased Gait velocity interpretation: 1.31 - 2.62 ft/sec, indicative of limited community ambulator   General Gait Details: steady gait with RW   Stairs             Wheelchair Mobility    Modified Rankin (Stroke Patients Only)       Balance Overall balance assessment: Needs assistance Sitting-balance support: No upper extremity supported, Feet supported Sitting balance-Leahy Scale: Good     Standing balance support: Bilateral upper extremity supported, During functional activity, No upper extremity supported Standing balance-Leahy Scale: Fair                              Cognition Arousal/Alertness: Awake/alert Behavior During Therapy: WFL for tasks assessed/performed Overall Cognitive Status: Within Functional Limits for tasks assessed                                          Exercises      General Comments        Pertinent Vitals/Pain Pain Assessment Pain Assessment: Faces Faces Pain Scale: Hurts a little bit Pain Location: R hip Pain Descriptors / Indicators: Discomfort Pain Intervention(s): Monitored during session    Home Living                          Prior Function            PT Goals (  current goals can now be found in the care plan section) Acute Rehab PT Goals Patient Stated Goal: home Progress towards PT goals: Progressing toward goals    Frequency    7X/week      PT Plan Equipment recommendations need to be updated    Co-evaluation              AM-PAC PT "6 Clicks" Mobility   Outcome Measure  Help needed turning from your back to your side while in a flat bed without using bedrails?: None Help needed moving from lying on your back to sitting on the side of a flat bed without using bedrails?: None Help needed moving to and from a bed to a chair (including a wheelchair)?: None Help needed standing up from a chair using your arms (e.g.,  wheelchair or bedside chair)?: None Help needed to walk in hospital room?: A Little Help needed climbing 3-5 steps with a railing? : A Little 6 Click Score: 22    End of Session Equipment Utilized During Treatment: Gait belt Activity Tolerance: Patient tolerated treatment well Patient left: in chair;with call bell/phone within reach Nurse Communication: Mobility status PT Visit Diagnosis: Unsteadiness on feet (R26.81);Muscle weakness (generalized) (M62.81);Difficulty in walking, not elsewhere classified (R26.2);Pain Pain - Right/Left: Right Pain - part of body: Hip     Time: 0932-3557 PT Time Calculation (min) (ACUTE ONLY): 14 min  Charges:  $Gait Training: 8-22 mins                     Lorrin Goodell, PT  Office # [phone] Pager [phone]    Virginia Castillo 07/18/2022, 9:11 AM

## 2022-07-18 NOTE — Progress Notes (Signed)
Subjective: 2 Days Post-Op Procedure(s) (LRB): RIGHT TOTAL HIP ARTHROPLASTY ANTERIOR APPROACH (Right) Patient reports pain as mild.    Objective: Vital signs in last 24 hours: Temp:  [98 F (36.7 C)-98.9 F (37.2 C)] 98 F (36.7 C) (08/10 2123) Pulse Rate:  [77-85] 77 (08/10 2123) Resp:  [18-19] 18 (08/10 2123) BP: (123-146)/(49-58) 146/58 (08/10 2123) SpO2:  [90 %-91 %] 91 % (08/10 2123)  Intake/Output from previous day: 08/10 0701 - 08/11 0700 In: 240 [P.O.:240] Out: -  Intake/Output this shift: No intake/output data recorded.  Recent Labs    07/17/22 0219  HGB 10.5*   Recent Labs    07/17/22 0219  WBC 7.0  RBC 3.54*  HCT 32.1*  PLT 186   No results for input(s): "NA", "K", "CL", "CO2", "BUN", "CREATININE", "GLUCOSE", "CALCIUM" in the last 72 hours. No results for input(s): "LABPT", "INR" in the last 72 hours.  Neurologically intact Neurovascular intact Sensation intact distally Intact pulses distally Dorsiflexion/Plantar flexion intact Incision: dressing C/D/I No cellulitis present Compartment soft   Assessment/Plan: 2 Days Post-Op Procedure(s) (LRB): RIGHT TOTAL HIP ARTHROPLASTY ANTERIOR APPROACH (Right) Advance diet Up with therapy D/C IV fluids WBAT RLE ABLA- mild and stable D/c after first PT session     Virginia Castillo 07/18/2022, 7:53 AM

## 2022-07-23 ENCOUNTER — Other Ambulatory Visit: Payer: Self-pay | Admitting: Physician Assistant

## 2022-07-23 ENCOUNTER — Telehealth: Payer: Self-pay | Admitting: Orthopaedic Surgery

## 2022-07-23 MED ORDER — OXYCODONE-ACETAMINOPHEN 5-325 MG PO TABS: 1.0000 | ORAL_TABLET | Freq: Three times a day (TID) | ORAL | 0 refills | Status: DC | PRN

## 2022-07-23 NOTE — Telephone Encounter (Signed)
sent 

## 2022-07-23 NOTE — Telephone Encounter (Signed)
Notified patient.

## 2022-07-23 NOTE — Telephone Encounter (Signed)
Pt called requesting a refill of oxycodone. Please sed to pharmacy on file. Pt [phone number is [phone].

## 2022-07-29 ENCOUNTER — Telehealth: Payer: Self-pay | Admitting: Orthopaedic Surgery

## 2022-07-29 NOTE — Telephone Encounter (Signed)
Received call from Clare Gandy (PTA) with [facility] advised went to see patient this morning daughter answered the door. Gershon Mussel said daughter said patient was to feeling well. Gershon Mussel said he is reporting a missed visit today. The number to contact patient is [phone]

## 2022-07-30 ENCOUNTER — Ambulatory Visit (INDEPENDENT_AMBULATORY_CARE_PROVIDER_SITE_OTHER): Payer: Medicare Other | Admitting: Orthopaedic Surgery

## 2022-07-30 ENCOUNTER — Encounter: Payer: Self-pay | Admitting: Orthopaedic Surgery

## 2022-07-30 DIAGNOSIS — Z96641 Presence of right artificial hip joint: Secondary | ICD-10-CM

## 2022-07-30 MED ORDER — OXYCODONE-ACETAMINOPHEN 5-325 MG PO TABS: 1.0000 | ORAL_TABLET | Freq: Every day | ORAL | 0 refills | Status: DC | PRN

## 2022-07-30 NOTE — Telephone Encounter (Signed)
done

## 2022-07-30 NOTE — Progress Notes (Signed)
   Post-Op Visit Note   Patient: Virginia Castillo           Date of Birth: 1951-11-07           MRN: [id number] Visit Date: 07/30/2022 PCP: Sueanne Margarita, DO   Assessment & Plan:  Chief Complaint:  Chief Complaint  Patient presents with   Right Hip - Follow-up    Right total hip arthroplasty 07/16/2022   Visit Diagnoses:  1. Status post total hip replacement, right     Plan: [name] is 2 weeks status post right total hip on 07/16/2022.  Overall feeling okay but yesterday felt drained.  Was not able to do physical therapy yesterday.  Denies any constitutional symptoms.  She has been walking with a single-point cane.  Examination of right hip shows mild swelling and expected postoperative changes.  No signs of infection.  Painless circumduction and range of motion of the hip.  Sutures removed Steri-Strips applied.  No evidence for infection.   I encouraged her to take it easy for couple days and then she can restart her walking exercises.  Dental prophylaxis reinforced.  Recheck in 4 weeks with standing AP pelvis x-rays.  Follow-Up Instructions: Return in about 4 weeks (around 08/27/2022).   Orders:  No orders of the defined types were placed in this encounter.  No orders of the defined types were placed in this encounter.   Imaging: No results found.  PMFS History: Patient Active Problem List   Diagnosis Date Noted   Status post total hip replacement, right 07/16/2022   Primary osteoarthritis of right hip 06/12/2022   WOUND, OPEN, LEG, WITHOUT COMPLICATION 38/93/7342   Past Medical History:  Diagnosis Date   Anxiety    Cancer [facility]) 2013   right breast   Depression    Ectopic pregnancy 1981   Family history of adverse reaction to anesthesia    daughter has PONV   Hypertension     No family history on file.  Past Surgical History:  Procedure Laterality Date   ANKLE FRACTURE SURGERY Right    MASTECTOMY Right 2013   TOTAL HIP ARTHROPLASTY Right 07/16/2022   Procedure: RIGHT  TOTAL HIP ARTHROPLASTY ANTERIOR APPROACH;  Surgeon: Leandrew Koyanagi, MD;  Location: [facility];  Service: Orthopedics;  Laterality: Right;  3-C   UNILATERAL SALPINGECTOMY  1981   due to ectopic pregnancy   Social History   Occupational History   Not on file  Tobacco Use   Smoking status: Former    Types: Cigarettes    Quit date: 04/16/2003    Years since quitting: 19.3   Smokeless tobacco: Never  Vaping Use   Vaping Use: Never used  Substance and Sexual Activity   Alcohol use: Not Currently   Drug use: Not Currently   Sexual activity: Not on file

## 2022-07-30 NOTE — Telephone Encounter (Signed)
Patient called in requesting refill on Oxycodone 

## 2022-07-31 NOTE — Telephone Encounter (Signed)
LMOM for patient

## 2022-08-04 ENCOUNTER — Other Ambulatory Visit: Payer: Self-pay | Admitting: Physician Assistant

## 2022-08-08 ENCOUNTER — Telehealth: Payer: Self-pay | Admitting: Orthopaedic Surgery

## 2022-08-08 NOTE — Telephone Encounter (Signed)
Patient called needing Rx refilled Oxycodone. The number to contact patient is  [phone]

## 2022-08-12 ENCOUNTER — Other Ambulatory Visit: Payer: Self-pay | Admitting: Physician Assistant

## 2022-08-12 MED ORDER — HYDROCODONE-ACETAMINOPHEN 5-325 MG PO TABS: 1.0000 | ORAL_TABLET | Freq: Every day | ORAL | 0 refills | Status: DC | PRN

## 2022-08-12 NOTE — Telephone Encounter (Signed)
Weaning to norco and I sent in

## 2022-08-19 ENCOUNTER — Ambulatory Visit (INDEPENDENT_AMBULATORY_CARE_PROVIDER_SITE_OTHER): Payer: Medicare Other | Admitting: Physician Assistant

## 2022-08-19 ENCOUNTER — Encounter: Payer: Self-pay | Admitting: Orthopaedic Surgery

## 2022-08-19 ENCOUNTER — Ambulatory Visit (INDEPENDENT_AMBULATORY_CARE_PROVIDER_SITE_OTHER): Payer: Medicare Other

## 2022-08-19 DIAGNOSIS — Z96641 Presence of right artificial hip joint: Secondary | ICD-10-CM | POA: Diagnosis not present

## 2022-08-19 NOTE — Progress Notes (Signed)
   Post-Op Visit Note   Patient: Virginia Castillo           Date of Birth: 24-Sep-1951           MRN: [id number] Visit Date: 08/19/2022 PCP: Sueanne Margarita, DO   Assessment & Plan:  Chief Complaint:  Chief Complaint  Patient presents with   Right Hip - Follow-up    Right total hip arthroplasty 07/16/2022   Visit Diagnoses:  1. Status post total hip replacement, right     Plan: Patient is a pleasant 71 year old female who comes in today 5 weeks status post right total hip replacement 07/16/2022.  She has been doing okay.  She did note increased pain last week but notes that this began after she went to church for the first time and sat for 2 hours.  Her symptoms have improved since last week.  Overall, doing well.  Examination of right hip reveals painless hip flexion and logroll.  She is neurovascular intact distally.  At this point, she will continue to advance with activity as tolerated.  She will take her baby aspirin twice daily for 1 more week.  Dental prophylaxis reinforced.  Follow-up with [place] in 7 weeks for repeat evaluation.  Call with concerns or questions.  Follow-Up Instructions: Return in about 7 weeks (around 10/07/2022).   Orders:  Orders Placed This Encounter  Procedures   XR Pelvis 1-2 Views   No orders of the defined types were placed in this encounter.   Imaging: No results found.  PMFS History: Patient Active Problem List   Diagnosis Date Noted   Status post total hip replacement, right 07/16/2022   Primary osteoarthritis of right hip 06/12/2022   WOUND, OPEN, LEG, WITHOUT COMPLICATION 91/79/1505   Past Medical History:  Diagnosis Date   Anxiety    Cancer [facility]) 2013   right breast   Depression    Ectopic pregnancy 1981   Family history of adverse reaction to anesthesia    daughter has PONV   Hypertension     No family history on file.  Past Surgical History:  Procedure Laterality Date   ANKLE FRACTURE SURGERY Right    MASTECTOMY Right 2013   TOTAL  HIP ARTHROPLASTY Right 07/16/2022   Procedure: RIGHT TOTAL HIP ARTHROPLASTY ANTERIOR APPROACH;  Surgeon: Leandrew Koyanagi, MD;  Location: [facility];  Service: Orthopedics;  Laterality: Right;  3-C   UNILATERAL SALPINGECTOMY  1981   due to ectopic pregnancy   Social History   Occupational History   Not on file  Tobacco Use   Smoking status: Former    Types: Cigarettes    Quit date: 04/16/2003    Years since quitting: 19.3   Smokeless tobacco: Never  Vaping Use   Vaping Use: Never used  Substance and Sexual Activity   Alcohol use: Not Currently   Drug use: Not Currently   Sexual activity: Not on file

## 2022-09-17 ENCOUNTER — Telehealth: Payer: Self-pay | Admitting: Orthopaedic Surgery

## 2022-09-17 NOTE — Telephone Encounter (Signed)
If the numbness is to the lateral hip, this is normal for after a hip replacement.  If sensation to that area returns at all, it can take up to one year.  Is her pain not any better since I saw her or has it worsened?  What is she taking?  If she seems concerned, may be best to have her come in to see [place] before the end of the month at her 12 week check up

## 2022-09-17 NOTE — Telephone Encounter (Signed)
Pt called requesting a call back. Pt states she is experiencing numbness and pains since surgery. Please call pt about this matter at [phone].

## 2022-09-24 ENCOUNTER — Telehealth: Payer: Self-pay | Admitting: Orthopaedic Surgery

## 2022-09-24 NOTE — Telephone Encounter (Signed)
Patient asked to speak with dr.Xu Nurse.

## 2022-09-25 ENCOUNTER — Ambulatory Visit (INDEPENDENT_AMBULATORY_CARE_PROVIDER_SITE_OTHER): Payer: Medicare Other | Admitting: Orthopaedic Surgery

## 2022-09-25 ENCOUNTER — Ambulatory Visit (INDEPENDENT_AMBULATORY_CARE_PROVIDER_SITE_OTHER): Payer: Medicare Other

## 2022-09-25 DIAGNOSIS — Z96641 Presence of right artificial hip joint: Secondary | ICD-10-CM

## 2022-09-25 DIAGNOSIS — M545 Low back pain, unspecified: Secondary | ICD-10-CM

## 2022-09-25 DIAGNOSIS — G8929 Other chronic pain: Secondary | ICD-10-CM | POA: Diagnosis not present

## 2022-09-25 MED ORDER — PREDNISONE 10 MG (21) PO TBPK: ORAL_TABLET | ORAL | 3 refills | Status: DC

## 2022-09-25 NOTE — Progress Notes (Signed)
Office Visit Note   Patient: Virginia Castillo           Date of Birth: 1951-05-05           MRN: [id number] Visit Date: 09/25/2022              Requested by: Sueanne Margarita, [address] [place],  [place] [address] PCP: Sueanne Margarita, DO   Assessment & Plan: Visit Diagnoses:  1. Chronic low back pain, unspecified back pain laterality, unspecified whether sciatica present   2. Status post total hip replacement, right     Plan: Based on findings, I think part of her symptoms are due to expected postsurgically which I would expect to resolve.  I think she may have a component of lumbar radiculopathy so I will send in prednisone dose pak.  She does have degenerative scoliosis.  Follow up as scheduled.    Follow-Up Instructions: No follow-ups on file.   Orders:  Orders Placed This Encounter  Procedures   XR Lumbar Spine 2-3 Views   Meds ordered this encounter  Medications   predniSONE (STERAPRED UNI-PAK 21 TAB) 10 MG (21) TBPK tablet    Sig: Take as directed    Dispense:  21 tablet    Refill:  3      Procedures: No procedures performed   Clinical Data: No additional findings.   Subjective: Chief Complaint  Patient presents with   Right Hip - Routine Post Op    HPI Virginia Castillo comes in today to be evaluated for pain and numbness in her right lower extremity.  She feels numbness down the entire leg.  Reports some start up stiffness in the hip.  She has returned back to baseline activity.  Review of Systems   Objective: Vital Signs: There were no vitals taken for this visit.  Physical Exam  Ortho Exam Examination of right hip shows fully healed surgical scar.  No evidence of infection.  Minimal swelling.  Painless fluid motion of the hip joint.   Specialty Comments:  No specialty comments available.  Imaging: XR Lumbar Spine 2-3 Views  Result Date: 09/25/2022 X-rays of the lumbar spine show degenerative scoliosis.  There is diffuse facet disease.    PMFS  History: Patient Active Problem List   Diagnosis Date Noted   Status post total hip replacement, right 07/16/2022   Primary osteoarthritis of right hip 06/12/2022   WOUND, OPEN, LEG, WITHOUT COMPLICATION 70/17/7939   Past Medical History:  Diagnosis Date   Anxiety    Cancer [facility]) 2013   right breast   Depression    Ectopic pregnancy 1981   Family history of adverse reaction to anesthesia    daughter has PONV   Hypertension     No family history on file.  Past Surgical History:  Procedure Laterality Date   ANKLE FRACTURE SURGERY Right    MASTECTOMY Right 2013   TOTAL HIP ARTHROPLASTY Right 07/16/2022   Procedure: RIGHT TOTAL HIP ARTHROPLASTY ANTERIOR APPROACH;  Surgeon: Leandrew Koyanagi, MD;  Location: [facility];  Service: Orthopedics;  Laterality: Right;  3-C   UNILATERAL SALPINGECTOMY  1981   due to ectopic pregnancy   Social History   Occupational History   Not on file  Tobacco Use   Smoking status: Former    Types: Cigarettes    Quit date: 04/16/2003    Years since quitting: 19.4   Smokeless tobacco: Never  Vaping Use   Vaping Use: Never used  Substance and Sexual Activity  Alcohol use: Not Currently   Drug use: Not Currently   Sexual activity: Not on file

## 2022-10-07 ENCOUNTER — Ambulatory Visit: Payer: Medicare Other | Admitting: Orthopaedic Surgery

## 2022-10-14 ENCOUNTER — Ambulatory Visit: Payer: Medicare Other | Admitting: Orthopaedic Surgery

## 2022-10-14 ENCOUNTER — Encounter: Payer: Self-pay | Admitting: Orthopaedic Surgery

## 2022-10-14 ENCOUNTER — Ambulatory Visit (INDEPENDENT_AMBULATORY_CARE_PROVIDER_SITE_OTHER): Payer: Medicare Other

## 2022-10-14 DIAGNOSIS — M542 Cervicalgia: Secondary | ICD-10-CM

## 2022-10-14 MED ORDER — PREDNISONE 10 MG (21) PO TBPK: ORAL_TABLET | ORAL | 3 refills | Status: DC

## 2022-10-14 NOTE — Progress Notes (Signed)
Office Visit Note   Patient: Virginia Castillo           Date of Birth: 1951/11/17           MRN: [id number] Visit Date: 10/14/2022              Requested by: Sueanne Margarita, [address] [place],  [place] [address] PCP: Sueanne Margarita, DO   Assessment & Plan: Visit Diagnoses:  1. Neck pain     Plan: Impression is cervical spondylosis and facet disease.  Disease process and treatment options were explained.  She would like to try a course of outpatient physical therapy and prednisone Dosepak.  Follow-up as needed for this.  Follow-Up Instructions: No follow-ups on file.   Orders:  Orders Placed This Encounter  Procedures   XR Cervical Spine 2 or 3 views   Ambulatory referral to Physical Therapy   Meds ordered this encounter  Medications   predniSONE (STERAPRED UNI-PAK 21 TAB) 10 MG (21) TBPK tablet    Sig: Take as directed    Dispense:  21 tablet    Refill:  3      Procedures: No procedures performed   Clinical Data: No additional findings.   Subjective: Chief Complaint  Patient presents with   Neck - Pain    HPI Virginia Castillo is a 71 year old female here for evaluation of neck pain gotten worse.  She has pain on the left side of her neck when turning her head to the left.  Denies any numbness and tingling or radicular symptoms.  Denies any injuries.  She is pleased with her hip replacement so far.  Review of Systems  Constitutional: Negative.   HENT: Negative.    Eyes: Negative.   Respiratory: Negative.    Cardiovascular: Negative.   Endocrine: Negative.   Musculoskeletal: Negative.   Neurological: Negative.   Hematological: Negative.   Psychiatric/Behavioral: Negative.    All other systems reviewed and are negative.    Objective: Vital Signs: There were no vitals taken for this visit.  Physical Exam Vitals and nursing note reviewed.  Constitutional:      Appearance: She is well-developed.  Pulmonary:     Effort: Pulmonary effort is normal.  Skin:     General: Skin is warm.     Capillary Refill: Capillary refill takes less than 2 seconds.  Neurological:     Mental Status: She is alert and oriented to person, place, and time.  Psychiatric:        Behavior: Behavior normal.        Thought Content: Thought content normal.        Judgment: Judgment normal.     Ortho Exam Examination of the cervical spine shows mild limitation with neck extension and flexion.  Pain with rotation of the head to the left.  Negative Spurling's. Specialty Comments:  No specialty comments available.  Imaging: XR Cervical Spine 2 or 3 views  Result Date: 10/14/2022 AP and lateral x-rays of the cervical spine show cervical spondylosis and facet disease.  Mild degenerative scoliosis.  Spurring of the uncovertebral joints.    PMFS History: Patient Active Problem List   Diagnosis Date Noted   Status post total hip replacement, right 07/16/2022   Primary osteoarthritis of right hip 06/12/2022   WOUND, OPEN, LEG, WITHOUT COMPLICATION 85/01/7740   Past Medical History:  Diagnosis Date   Anxiety    Cancer [facility]) 2013   right breast   Depression    Ectopic pregnancy 1981  Family history of adverse reaction to anesthesia    daughter has PONV   Hypertension     No family history on file.  Past Surgical History:  Procedure Laterality Date   ANKLE FRACTURE SURGERY Right    MASTECTOMY Right 2013   TOTAL HIP ARTHROPLASTY Right 07/16/2022   Procedure: RIGHT TOTAL HIP ARTHROPLASTY ANTERIOR APPROACH;  Surgeon: Leandrew Koyanagi, MD;  Location: [facility];  Service: Orthopedics;  Laterality: Right;  3-C   UNILATERAL SALPINGECTOMY  1981   due to ectopic pregnancy   Social History   Occupational History   Not on file  Tobacco Use   Smoking status: Former    Types: Cigarettes    Quit date: 04/16/2003    Years since quitting: 19.5   Smokeless tobacco: Never  Vaping Use   Vaping Use: Never used  Substance and Sexual Activity   Alcohol use: Not Currently   Drug  use: Not Currently   Sexual activity: Not on file

## 2022-10-21 ENCOUNTER — Ambulatory Visit: Payer: Medicare Other | Admitting: Physical Therapy

## 2022-10-22 ENCOUNTER — Ambulatory Visit: Payer: Medicare Other | Admitting: Physical Therapy

## 2022-10-22 NOTE — Therapy (Incomplete)
OUTPATIENT PHYSICAL THERAPY CERVICAL EVALUATION   Patient Name: Virginia Castillo MRN: [id number] DOB:[birth year], 71 y.o., female Today's Date: 10/22/2022    Past Medical History:  Diagnosis Date   Anxiety    Cancer [facility]) 2013   right breast   Depression    Ectopic pregnancy 1981   Family history of adverse reaction to anesthesia    daughter has PONV   Hypertension    Past Surgical History:  Procedure Laterality Date   ANKLE FRACTURE SURGERY Right    MASTECTOMY Right 2013   TOTAL HIP ARTHROPLASTY Right 07/16/2022   Procedure: RIGHT TOTAL HIP ARTHROPLASTY ANTERIOR APPROACH;  Surgeon: Leandrew Koyanagi, MD;  Location: [facility];  Service: Orthopedics;  Laterality: Right;  3-C   UNILATERAL SALPINGECTOMY  1981   due to ectopic pregnancy   Patient Active Problem List   Diagnosis Date Noted   Status post total hip replacement, right 07/16/2022   Primary osteoarthritis of right hip 06/12/2022   WOUND, OPEN, LEG, WITHOUT COMPLICATION 76/16/0737    PCP: Sueanne Margarita, DO   REFERRING PROVIDER: Leandrew Koyanagi, MD  REFERRING DIAG: Cervical spondylosis, facet disease  THERAPY DIAG:  No diagnosis found.  Rationale for Evaluation and Treatment: Rehabilitation  ONSET DATE:   SUBJECTIVE:                                                                                                                                                                                                         SUBJECTIVE STATEMENT:  She has pain on the left side of her neck when turning her head to the left.  Denies any numbness and tingling or radicular symptoms.  Denies any injuries.  PERTINENT HISTORY:   PMH includes breast cancer, depression, anxiety, HTN, Rt hip THA 07/16/22  PAIN:  Are you having pain? Yes: NPRS scale: ***/10 Pain location: *** Pain description: *** Aggravating factors: *** Relieving factors: ***  PRECAUTIONS: None  WEIGHT BEARING RESTRICTIONS: No  FALLS:  Has patient fallen in last 6  months? {fallsyesno:27318}  OCCUPATION: ***  PLOF: {PLOF:24004}  PATIENT GOALS: ***  NEXT MD VISIT:   OBJECTIVE:   DIAGNOSTIC FINDINGS:  Result Date: 10/14/2022 AP and lateral x-rays of the cervical spine show cervical spondylosis and facet disease.  Mild degenerative scoliosis.  Spurring of the uncovertebral joints.   PATIENT SURVEYS:  FOTO ***  COGNITION: Overall cognitive status: {cognition:24006}  SENSATION: {sensation:27233}  POSTURE: {posture:25561}  PALPATION: ***   CERVICAL ROM:   {AROM/PROM:27142} ROM A/PROM (deg) eval  Flexion   Extension   Right lateral flexion  Left lateral flexion   Right rotation   Left rotation    (Blank rows = not tested)  UPPER EXTREMITY ROM:  {AROM/PROM:27142} ROM Right eval Left eval  Shoulder flexion    Shoulder extension    Shoulder abduction    Shoulder adduction    Shoulder extension    Shoulder internal rotation    Shoulder external rotation    Elbow flexion    Elbow extension    Wrist flexion    Wrist extension    Wrist ulnar deviation    Wrist radial deviation    Wrist pronation    Wrist supination     (Blank rows = not tested)  UPPER EXTREMITY MMT:  MMT Right eval Left eval  Shoulder flexion    Shoulder extension    Shoulder abduction    Shoulder adduction    Shoulder extension    Shoulder internal rotation    Shoulder external rotation    Middle trapezius    Lower trapezius    Elbow flexion    Elbow extension    Wrist flexion    Wrist extension    Wrist ulnar deviation    Wrist radial deviation    Wrist pronation    Wrist supination    Grip strength     (Blank rows = not tested)  CERVICAL SPECIAL TESTS:  {Cervical special tests:25246}  FUNCTIONAL TESTS:  {Functional tests:24029}   TODAY'S TREATMENT:  Eval HEP creation and review, see below for details   PATIENT EDUCATION: Education details: HEP, PT plan of care Person educated: Patient Education method: Explanation,  Demonstration, Verbal cues, and Handouts Education comprehension: verbalized understanding and needs further education   HOME EXERCISE PROGRAM: ***  ASSESSMENT:  CLINICAL IMPRESSION: Patient referred to PT for  .Patient will benefit from skilled PT to address below impairments, limitations and improve overall function.  OBJECTIVE IMPAIRMENTS: decreased activity tolerance, decreased shoulder mobility, decreased ROM, decreased strength, impaired flexibility, impaired UE use, postural dysfunction, and pain.  ACTIVITY LIMITATIONS: reaching, lifting, carry,  cleaning, driving, and or occupation  PERSONAL FACTORS:  PMH includes breast cancer, depression, anxiety, HTN, Rt hip THA 07/16/22 also affecting patient's functional outcome.  REHAB POTENTIAL: Good  CLINICAL DECISION MAKING: Stable/uncomplicated  EVALUATION COMPLEXITY: Low    GOALS: Short term PT Goals Target date: {follow up:25551} Pt will be I and compliant with HEP. Baseline:  Goal status: New Pt will decrease pain by 25% overall Baseline: Goal status: New  Long term PT goals Target date: {follow up:25551} Pt will improve neck AROM to [facility] to improve functional reaching Baseline: Goal status: New Pt will improve shoulder strength to at least 4+/5 MMT to improve functional strength Baseline: Goal status: New Pt will improve FOTO to at least % functional to show improved function Baseline: Goal status: New Pt will reduce pain to overall less than 3/10 with usual activity and work activity. Baseline: Goal status: New  PLAN: PT FREQUENCY: 1-3 times per week   PT DURATION: 12 weeks  PLANNED INTERVENTIONS (unless contraindicated): aquatic PT, Canalith repositioning, cryotherapy, Electrical stimulation, Iontophoresis with 4 mg/ml dexamethasome, Moist heat, traction, Ultrasound, gait training, Therapeutic exercise, balance training, neuromuscular re-education, patient/family education, prosthetic training, manual  techniques, passive ROM, dry needling, taping, vasopnuematic device, vestibular, spinal manipulations, joint manipulations  PLAN FOR NEXT SESSION: review HEP    Debbe Odea, PT 10/22/2022, 9:32 AM

## 2022-12-26 DIAGNOSIS — L03115 Cellulitis of right lower limb: Secondary | ICD-10-CM | POA: Diagnosis not present

## 2022-12-26 DIAGNOSIS — Z9889 Other specified postprocedural states: Secondary | ICD-10-CM | POA: Diagnosis not present

## 2023-02-24 DIAGNOSIS — M654 Radial styloid tenosynovitis [de Quervain]: Secondary | ICD-10-CM | POA: Diagnosis not present

## 2023-02-24 DIAGNOSIS — E785 Hyperlipidemia, unspecified: Secondary | ICD-10-CM | POA: Diagnosis not present

## 2023-02-24 DIAGNOSIS — R3121 Asymptomatic microscopic hematuria: Secondary | ICD-10-CM | POA: Diagnosis not present

## 2023-02-24 DIAGNOSIS — N3281 Overactive bladder: Secondary | ICD-10-CM | POA: Diagnosis not present

## 2023-02-24 DIAGNOSIS — M8580 Other specified disorders of bone density and structure, unspecified site: Secondary | ICD-10-CM | POA: Diagnosis not present

## 2023-02-24 DIAGNOSIS — Z853 Personal history of malignant neoplasm of breast: Secondary | ICD-10-CM | POA: Diagnosis not present

## 2023-02-24 DIAGNOSIS — I10 Essential (primary) hypertension: Secondary | ICD-10-CM | POA: Diagnosis not present

## 2023-02-24 DIAGNOSIS — Z566 Other physical and mental strain related to work: Secondary | ICD-10-CM | POA: Diagnosis not present

## 2023-02-24 DIAGNOSIS — G4700 Insomnia, unspecified: Secondary | ICD-10-CM | POA: Diagnosis not present

## 2023-03-09 ENCOUNTER — Telehealth: Payer: Self-pay | Admitting: Orthopaedic Surgery

## 2023-03-09 ENCOUNTER — Other Ambulatory Visit: Payer: Self-pay

## 2023-03-09 DIAGNOSIS — M542 Cervicalgia: Secondary | ICD-10-CM

## 2023-03-09 NOTE — Telephone Encounter (Signed)
He will need to get MRI of the C-spine first.

## 2023-03-09 NOTE — Telephone Encounter (Signed)
Patient called asked if she can get an cortisone injection in her neck due to the pain she is having. The number to contact patient is [phone]

## 2023-03-09 NOTE — Telephone Encounter (Signed)
Notified patient and placed order for MRI c-spine

## 2023-03-26 DIAGNOSIS — L989 Disorder of the skin and subcutaneous tissue, unspecified: Secondary | ICD-10-CM | POA: Diagnosis not present

## 2023-03-26 DIAGNOSIS — Z9889 Other specified postprocedural states: Secondary | ICD-10-CM | POA: Diagnosis not present

## 2023-03-26 DIAGNOSIS — B359 Dermatophytosis, unspecified: Secondary | ICD-10-CM | POA: Diagnosis not present

## 2023-04-06 ENCOUNTER — Inpatient Hospital Stay: Admission: RE | Admit: 2023-04-06 | Payer: Medicare Other | Source: Ambulatory Visit

## 2023-04-06 ENCOUNTER — Telehealth: Payer: Self-pay | Admitting: Orthopaedic Surgery

## 2023-04-06 NOTE — Telephone Encounter (Signed)
Called patient. She is scheduled for an MRI this evening. She would need to pay $200 for the MRI. She states that she does not have money to pay for it at this time. She is going to call [facility] about this matter, and cancel if needed. She is aware that she cannot get an injection without first having an MRI.

## 2023-04-06 NOTE — Telephone Encounter (Signed)
Patient advising that she received a call stating that she was going to have to Pay 200.00 for a cortisone shot and she is asking why? Please advise

## 2023-04-17 DIAGNOSIS — H401123 Primary open-angle glaucoma, left eye, severe stage: Secondary | ICD-10-CM | POA: Diagnosis not present

## 2023-04-17 DIAGNOSIS — H401111 Primary open-angle glaucoma, right eye, mild stage: Secondary | ICD-10-CM | POA: Diagnosis not present

## 2023-09-03 DIAGNOSIS — I10 Essential (primary) hypertension: Secondary | ICD-10-CM | POA: Diagnosis not present

## 2023-09-03 DIAGNOSIS — Z1389 Encounter for screening for other disorder: Secondary | ICD-10-CM | POA: Diagnosis not present

## 2023-09-03 DIAGNOSIS — E785 Hyperlipidemia, unspecified: Secondary | ICD-10-CM | POA: Diagnosis not present

## 2023-09-03 DIAGNOSIS — M8580 Other specified disorders of bone density and structure, unspecified site: Secondary | ICD-10-CM | POA: Diagnosis not present

## 2023-09-17 DIAGNOSIS — G4700 Insomnia, unspecified: Secondary | ICD-10-CM | POA: Diagnosis not present

## 2023-09-17 DIAGNOSIS — M1990 Unspecified osteoarthritis, unspecified site: Secondary | ICD-10-CM | POA: Diagnosis not present

## 2023-09-17 DIAGNOSIS — E785 Hyperlipidemia, unspecified: Secondary | ICD-10-CM | POA: Diagnosis not present

## 2023-09-17 DIAGNOSIS — Z853 Personal history of malignant neoplasm of breast: Secondary | ICD-10-CM | POA: Diagnosis not present

## 2023-09-17 DIAGNOSIS — M654 Radial styloid tenosynovitis [de Quervain]: Secondary | ICD-10-CM | POA: Diagnosis not present

## 2023-09-17 DIAGNOSIS — R82998 Other abnormal findings in urine: Secondary | ICD-10-CM | POA: Diagnosis not present

## 2023-09-17 DIAGNOSIS — N3281 Overactive bladder: Secondary | ICD-10-CM | POA: Diagnosis not present

## 2023-09-17 DIAGNOSIS — I10 Essential (primary) hypertension: Secondary | ICD-10-CM | POA: Diagnosis not present

## 2023-09-17 DIAGNOSIS — M810 Age-related osteoporosis without current pathological fracture: Secondary | ICD-10-CM | POA: Diagnosis not present

## 2023-09-17 DIAGNOSIS — Z0000 Encounter for general adult medical examination without abnormal findings: Secondary | ICD-10-CM | POA: Diagnosis not present

## 2023-09-29 DIAGNOSIS — L570 Actinic keratosis: Secondary | ICD-10-CM | POA: Diagnosis not present

## 2023-09-29 DIAGNOSIS — X32XXXD Exposure to sunlight, subsequent encounter: Secondary | ICD-10-CM | POA: Diagnosis not present

## 2023-09-29 DIAGNOSIS — L718 Other rosacea: Secondary | ICD-10-CM | POA: Diagnosis not present

## 2023-11-08 ENCOUNTER — Other Ambulatory Visit: Payer: Self-pay

## 2023-11-08 ENCOUNTER — Emergency Department (HOSPITAL_COMMUNITY)
Admission: EM | Admit: 2023-11-08 | Discharge: 2023-11-08 | Disposition: A | Discharge status: Home / Self Care | Payer: Medicare HMO | Attending: Emergency Medicine | Admitting: Emergency Medicine

## 2023-11-08 DIAGNOSIS — F411 Generalized anxiety disorder: Secondary | ICD-10-CM | POA: Diagnosis not present

## 2023-11-08 DIAGNOSIS — R454 Irritability and anger: Secondary | ICD-10-CM

## 2023-11-08 DIAGNOSIS — W19XXXA Unspecified fall, initial encounter: Secondary | ICD-10-CM | POA: Diagnosis not present

## 2023-11-08 DIAGNOSIS — R45851 Suicidal ideations: Secondary | ICD-10-CM | POA: Diagnosis not present

## 2023-11-08 DIAGNOSIS — Z79899 Other long term (current) drug therapy: Secondary | ICD-10-CM | POA: Diagnosis not present

## 2023-11-08 DIAGNOSIS — I10 Essential (primary) hypertension: Secondary | ICD-10-CM | POA: Diagnosis not present

## 2023-11-08 NOTE — ED Notes (Signed)
Pt continues to be uncooperative, refusing to get in bed

## 2023-11-08 NOTE — Discharge Instructions (Signed)
Please return to the hospital with any new concerns at any time.

## 2023-11-08 NOTE — ED Notes (Signed)
Call to pt daughter, she will be by to pick up pt at discharge, pt discharged and sent to lobby to wait with a sandwich and drink
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
## 2023-11-08 NOTE — ED Notes (Signed)
Pt continues to refuse to get back in [place] [facility] and is increasingly agitated.  Security called to bedside after pt refused to go back to bed to await discharge.  Pt has been given updates on why she is here and status of when she can leave.
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
## 2023-11-08 NOTE — ED Notes (Signed)
Unable to get temp due to pt not cooperating

## 2023-12-04 ENCOUNTER — Other Ambulatory Visit: Payer: Self-pay | Admitting: Physician Assistant

## 2023-12-15 DIAGNOSIS — H401123 Primary open-angle glaucoma, left eye, severe stage: Secondary | ICD-10-CM | POA: Diagnosis not present

## 2023-12-15 DIAGNOSIS — Z961 Presence of intraocular lens: Secondary | ICD-10-CM | POA: Diagnosis not present

## 2023-12-15 DIAGNOSIS — H47232 Glaucomatous optic atrophy, left eye: Secondary | ICD-10-CM | POA: Diagnosis not present

## 2023-12-15 DIAGNOSIS — H401111 Primary open-angle glaucoma, right eye, mild stage: Secondary | ICD-10-CM | POA: Diagnosis not present

## 2024-01-02 ENCOUNTER — Emergency Department (HOSPITAL_COMMUNITY): Payer: Medicare HMO

## 2024-01-02 ENCOUNTER — Emergency Department (HOSPITAL_COMMUNITY)
Admission: EM | Admit: 2024-01-02 | Discharge: 2024-01-02 | Disposition: A | Discharge status: Home / Self Care | Payer: Medicare HMO | Attending: Emergency Medicine | Admitting: Emergency Medicine

## 2024-01-02 DIAGNOSIS — S51822A Laceration with foreign body of left forearm, initial encounter: Secondary | ICD-10-CM | POA: Diagnosis not present

## 2024-01-02 DIAGNOSIS — W1830XA Fall on same level, unspecified, initial encounter: Secondary | ICD-10-CM | POA: Insufficient documentation

## 2024-01-02 DIAGNOSIS — M1812 Unilateral primary osteoarthritis of first carpometacarpal joint, left hand: Secondary | ICD-10-CM | POA: Diagnosis not present

## 2024-01-02 DIAGNOSIS — R45851 Suicidal ideations: Secondary | ICD-10-CM | POA: Diagnosis not present

## 2024-01-02 DIAGNOSIS — S51811A Laceration without foreign body of right forearm, initial encounter: Secondary | ICD-10-CM

## 2024-01-02 DIAGNOSIS — Z23 Encounter for immunization: Secondary | ICD-10-CM | POA: Insufficient documentation

## 2024-01-02 DIAGNOSIS — W19XXXA Unspecified fall, initial encounter: Secondary | ICD-10-CM | POA: Diagnosis not present

## 2024-01-02 DIAGNOSIS — Z043 Encounter for examination and observation following other accident: Secondary | ICD-10-CM | POA: Diagnosis not present

## 2024-01-02 DIAGNOSIS — F32A Depression, unspecified: Secondary | ICD-10-CM | POA: Diagnosis not present

## 2024-01-02 MED ORDER — CEPHALEXIN 250 MG PO CAPS
500.0000 mg | ORAL_CAPSULE | Freq: Once | ORAL | Status: AC
Start: 2024-01-02 — End: 2024-01-02
  Administered 2024-01-02: 500 mg via ORAL
  Filled 2024-01-02: qty 2

## 2024-01-02 MED ORDER — BACITRACIN ZINC 500 UNIT/GM EX OINT: 1.0000 | TOPICAL_OINTMENT | Freq: Two times a day (BID) | CUTANEOUS | 0 refills | Status: DC

## 2024-01-02 MED ORDER — BACITRACIN ZINC 500 UNIT/GM EX OINT: TOPICAL_OINTMENT | Freq: Two times a day (BID) | CUTANEOUS | Status: DC

## 2024-01-02 MED ORDER — OXYCODONE-ACETAMINOPHEN 5-325 MG PO TABS
1.0000 | ORAL_TABLET | Freq: Once | ORAL | Status: AC
  Administered 2024-01-02: 1 via ORAL
  Filled 2024-01-02: qty 1

## 2024-01-02 MED ORDER — CEPHALEXIN 500 MG PO CAPS: 500.0000 mg | ORAL_CAPSULE | Freq: Four times a day (QID) | ORAL | 0 refills | Status: DC

## 2024-01-02 MED ORDER — LIDOCAINE HCL (PF) 1 % IJ SOLN: 30.0000 mL | Freq: Once | INTRAMUSCULAR | Status: AC

## 2024-01-02 MED ORDER — TETANUS-DIPHTH-ACELL PERTUSSIS 5-2.5-18.5 LF-MCG/0.5 IM SUSY
0.5000 mL | PREFILLED_SYRINGE | Freq: Once | INTRAMUSCULAR | Status: AC
  Administered 2024-01-02: 0.5 mL via INTRAMUSCULAR
  Filled 2024-01-02: qty 0.5

## 2024-01-02 MED ORDER — LIDOCAINE HCL (PF) 1 % IJ SOLN
INTRAMUSCULAR | Status: AC
  Filled 2024-01-02: qty 30

## 2024-01-02 NOTE — ED Provider Notes (Signed)
[place] EMERGENCY DEPARTMENT AT [facility] Provider Note   CSN: [id number] Arrival date & time: 01/02/24  1846     History {Add pertinent medical, surgical, social history, OB history to HPI:1} Chief Complaint  Patient presents with   Fall   Arm Injury    Virginia Castillo is a 73 y.o. female.  HPI    73 year old female Home Medications Prior to Admission medications   Medication Sig Start Date End Date Taking? Authorizing Provider  amLODipine (NORVASC) 5 MG tablet Take 5 mg by mouth daily.    [provider]  amoxicillin-clavulanate (AUGMENTIN) 875-125 MG tablet Take 1 tablet by mouth 2 (two) times daily. Please start taking when finished with previous augmentin prescription 07/16/22   Cristie Hem, PA-C  atorvastatin (LIPITOR) 40 MG tablet Take 40 mg by mouth daily.    [provider]  clonazePAM (KLONOPIN) 0.5 MG tablet Take 0.5 mg by mouth daily as needed for anxiety.    [provider]  docusate sodium (COLACE) 100 MG capsule TAKE 1 CAPSULE(100 MG) BY MOUTH DAILY AS NEEDED 12/07/23   Cristie Hem, PA-C  HYDROcodone-acetaminophen (NORCO) 5-325 MG tablet Take 1-2 tablets by mouth daily as needed. 08/12/22   Cristie Hem, PA-C  latanoprost (XALATAN) 0.005 % ophthalmic solution Place 1 drop into the right eye at bedtime. 04/15/22   [provider]  methocarbamol (ROBAXIN) 500 MG tablet Take 1 tablet (500 mg total) by mouth 2 (two) times daily as needed. To be taken after surgery 07/11/22   Cristie Hem, PA-C  ondansetron (ZOFRAN) 4 MG tablet Take 1 tablet (4 mg total) by mouth every 8 (eight) hours as needed for nausea or vomiting. 07/11/22   Cristie Hem, PA-C  oxyCODONE-acetaminophen (PERCOCET) 5-325 MG tablet Take 1-2 tablets by mouth daily as needed. To be taken after surgery 07/30/22   Tarry Kos, MD  predniSONE (STERAPRED UNI-PAK 21 TAB) 10 MG (21) TBPK tablet Take as directed 09/25/22   Tarry Kos, MD  predniSONE  (STERAPRED UNI-PAK 21 TAB) 10 MG (21) TBPK tablet Take as directed 10/14/22   Tarry Kos, MD  sertraline (ZOLOFT) 50 MG tablet Take 50 mg by mouth daily.    [provider]  traZODone (DESYREL) 150 MG tablet Take 150 mg by mouth at bedtime.    [provider]      Allergies    Patient has no known allergies.    Review of Systems   Review of Systems  Physical Exam Updated Vital Signs BP 127/72 (BP Location: Left Arm)   Pulse 86   Temp 97.9 F (36.6 C) (Oral)   Resp 15   SpO2 95%  Physical Exam  ED Results / Procedures / Treatments   Labs (all labs ordered are listed, but only abnormal results are displayed) Labs Reviewed - No data to display  EKG None  Radiology DG Forearm Left Result Date: 01/02/2024 CLINICAL DATA:  fall EXAM: LEFT FOREARM - 2 VIEW COMPARISON:  None Available. FINDINGS: There is no evidence of fracture or other focal bone lesions. Degenerative changes of the first carpometacarpal joint. Retained elbow are grossly unremarkable. soft tissues are unremarkable. IMPRESSION: No acute displaced fracture or dislocation. Electronically Signed   By: Tish Frederickson M.D.   On: 01/02/2024 20:51    Procedures Procedures  {Document cardiac monitor, telemetry assessment procedure when appropriate:1}  Medications Ordered in ED Medications  bacitracin ointment (has no administration in time range)  Tdap (  BOOSTRIX) injection 0.5 mL (0.5 mLs Intramuscular Given 01/02/24 2012)  oxyCODONE-acetaminophen (PERCOCET/ROXICET) 5-325 MG per tablet 1 tablet (1 tablet Oral Given 01/02/24 2013)  lidocaine (PF) (XYLOCAINE) 1 % injection 30 mL ( Other Given 01/02/24 2138)    ED Course/ Medical Decision Making/ A&P   {   Click here for ABCD2, HEART and other calculatorsREFRESH Note before signing :1}                              Medical Decision Making Amount and/or Complexity of Data Reviewed Radiology: ordered.  Risk OTC drugs. Prescription drug  management.   ***  {Document critical care time when appropriate:1} {Document review of labs and clinical decision tools ie heart score, Chads2Vasc2 etc:1}  {Document your independent review of radiology images, and any outside records:1} {Document your discussion with family members, caretakers, and with consultants:1} {Document social determinants of health affecting pt's care:1} {Document your decision making why or why not admission, treatments were needed:1} Final Clinical Impression(s) / ED Diagnoses Final diagnoses:  None    Rx / DC Orders ED Discharge Orders     None

## 2024-01-02 NOTE — ED Notes (Signed)
Cleaned pt's wound with wound cleanser & dressed with nonadherent dressing with cling holding it in place. Bleeding controlled & pt tolerated well.

## 2024-01-02 NOTE — Discharge Instructions (Addendum)
We saw you in the ER for your WOUND. Please read the instructions provided on wound care. Keep the area clean and dry, apply bacitracin ointment daily and take the medications provided. RETURN TO THE ER IF THERE IS INCREASED PAIN, REDNESS, PUS COMING OUT from the wound site.

## 2024-01-02 NOTE — ED Triage Notes (Addendum)
Patient from home via EMS-[facility] this morning and had fall, which she remembers. She got up from the fall and went to bed and when she woke up she noticed L arm injury which she did not notice before the nap. EMS reports severe avulsion which they wrapped PTA. Bleeding controlled on EMS arrival. Ambulatory. Denies pain. Last tetanus "when I was [age]." Drinking more recently d/t depression.

## 2024-01-08 DIAGNOSIS — Z566 Other physical and mental strain related to work: Secondary | ICD-10-CM | POA: Diagnosis not present

## 2024-01-08 DIAGNOSIS — Z4889 Encounter for other specified surgical aftercare: Secondary | ICD-10-CM | POA: Diagnosis not present

## 2024-01-08 DIAGNOSIS — I10 Essential (primary) hypertension: Secondary | ICD-10-CM | POA: Diagnosis not present

## 2024-01-08 DIAGNOSIS — S41112A Laceration without foreign body of left upper arm, initial encounter: Secondary | ICD-10-CM | POA: Diagnosis not present

## 2024-01-08 DIAGNOSIS — W19XXXA Unspecified fall, initial encounter: Secondary | ICD-10-CM | POA: Diagnosis not present

## 2024-01-12 DIAGNOSIS — S41112A Laceration without foreign body of left upper arm, initial encounter: Secondary | ICD-10-CM | POA: Diagnosis not present

## 2024-01-12 DIAGNOSIS — W19XXXA Unspecified fall, initial encounter: Secondary | ICD-10-CM | POA: Diagnosis not present

## 2024-01-12 DIAGNOSIS — I10 Essential (primary) hypertension: Secondary | ICD-10-CM | POA: Diagnosis not present

## 2024-01-12 DIAGNOSIS — Z4802 Encounter for removal of sutures: Secondary | ICD-10-CM | POA: Diagnosis not present

## 2024-03-29 DIAGNOSIS — I10 Essential (primary) hypertension: Secondary | ICD-10-CM | POA: Diagnosis not present

## 2024-03-29 DIAGNOSIS — E785 Hyperlipidemia, unspecified: Secondary | ICD-10-CM | POA: Diagnosis not present

## 2024-03-29 DIAGNOSIS — M1990 Unspecified osteoarthritis, unspecified site: Secondary | ICD-10-CM | POA: Diagnosis not present

## 2024-03-29 DIAGNOSIS — Z780 Asymptomatic menopausal state: Secondary | ICD-10-CM | POA: Diagnosis not present

## 2024-03-29 DIAGNOSIS — M810 Age-related osteoporosis without current pathological fracture: Secondary | ICD-10-CM | POA: Diagnosis not present

## 2024-03-29 DIAGNOSIS — N3281 Overactive bladder: Secondary | ICD-10-CM | POA: Diagnosis not present

## 2024-03-29 DIAGNOSIS — Z853 Personal history of malignant neoplasm of breast: Secondary | ICD-10-CM | POA: Diagnosis not present

## 2024-03-29 DIAGNOSIS — G4700 Insomnia, unspecified: Secondary | ICD-10-CM | POA: Diagnosis not present

## 2024-03-29 DIAGNOSIS — Z566 Other physical and mental strain related to work: Secondary | ICD-10-CM | POA: Diagnosis not present

## 2024-04-12 ENCOUNTER — Ambulatory Visit: Payer: Self-pay

## 2024-04-12 ENCOUNTER — Encounter: Payer: Self-pay | Admitting: Sports Medicine

## 2024-04-12 ENCOUNTER — Ambulatory Visit (INDEPENDENT_AMBULATORY_CARE_PROVIDER_SITE_OTHER): Admitting: Sports Medicine

## 2024-04-12 ENCOUNTER — Other Ambulatory Visit: Payer: Self-pay

## 2024-04-12 ENCOUNTER — Ambulatory Visit: Admitting: Orthopaedic Surgery

## 2024-04-12 DIAGNOSIS — M1612 Unilateral primary osteoarthritis, left hip: Secondary | ICD-10-CM | POA: Diagnosis not present

## 2024-04-12 DIAGNOSIS — M25552 Pain in left hip: Secondary | ICD-10-CM

## 2024-04-12 MED ORDER — LIDOCAINE HCL 1 % IJ SOLN
4.0000 mL | INTRAMUSCULAR | Status: AC | PRN
  Administered 2024-04-12: 4 mL

## 2024-04-12 MED ORDER — METHYLPREDNISOLONE ACETATE 40 MG/ML IJ SUSP
80.0000 mg | INTRAMUSCULAR | Status: AC | PRN
  Administered 2024-04-12: 80 mg via INTRA_ARTICULAR

## 2024-04-12 NOTE — Progress Notes (Signed)
   Procedure Note  Patient: Virginia Castillo             Date of Birth: 29-Jun-1951           MRN: [id number]             Visit Date: 04/12/2024  Procedures: Visit Diagnoses:  1. Unilateral primary osteoarthritis, left hip   2. Pain in left hip    Large Joint Inj: L hip joint on 04/12/2024 1:47 PM Indications: pain Details: 22 G 3.5 in needle, ultrasound-guided anterior approach Medications: 4 mL lidocaine  1 %; 80 mg methylPREDNISolone acetate 40 MG/ML Outcome: tolerated well, no immediate complications  Procedure: US -guided intra-articular hip injection, Left After discussion on risks/benefits/indications and informed verbal consent was obtained, a timeout was performed. Patient was lying supine on exam table. The hip was cleaned with betadine  and alcohol swabs . Then utilizing ultrasound guidance, the patient's femoral head and neck junction was identified and subsequently injected with 4:2 lidocaine :depomedrol via an in-plane approach with ultrasound visualization of the injectate administered into the hip joint. Patient tolerated procedure well without immediate complications.  Procedure, treatment alternatives, risks and benefits explained, specific risks discussed. Consent was given by the patient. Immediately prior to procedure a time out was called to verify the correct patient, procedure, equipment, support staff and site/side marked as required. Patient was prepped and draped in the usual sterile fashion.     - patient tolerated procedure well, discussed post-injection protocol - follow-up with Dr. Christiane Cowing as indicated; I am happy to see them as needed. We discussed 68-months prior to planned hip replacement if needed  Shauna Del, DO Primary Care Sports Medicine Physician  [facility]  This note was dictated using Dragon naturally speaking software and may contain errors in syntax, spelling, or content which have not been identified prior to signing this note.

## 2024-04-12 NOTE — Progress Notes (Signed)
 Office Visit Note   Patient: Virginia Castillo           Date of Birth: 08-06-1951           MRN: [id number] Visit Date: 04/12/2024              Requested by: Windell Hasty, DO [address] [place],  [place] [address] PCP: Windell Hasty, DO   Assessment & Plan: Visit Diagnoses:  1. Pain in left hip     History of Present Illness Virginia Castillo is a 73 year old female who presents with left hip pain.  She has experienced significant left hip pain since before her right hip replacement nearly two years ago. The pain has progressively worsened, affecting her ability to walk and causing minimal weight loss due to limited mobility. The pain is occasionally 'piercing' when she turns her leg a certain way, radiating through her groin and hip.  A recent fall occurred due to a sudden onset of pain while stepping, resulting in a hard landing and requiring six stitches.  There is no tenderness in the hip area.  Physical Exam MUSCULOSKELETAL: Left hip internal rotation limited to zero degrees, external rotation to twenty degrees, pain with flexion. No tenderness in left hip.  Positive Stinchfield sign.  Positive FADIR.  Results RADIOLOGY Hip X-ray: Right hip arthroplasty appears intact. Left hip demonstrates bone-on-bone articulation. (04/12/2024)  Assessment and Plan Left hip osteoarthritis Chronic severe degeneration with bone-on-bone contact. Significant pain limits ambulation, occasional falls due to piercing pain.  She would like to look at surgery for late November but in the meantime she would like to manage the symptoms with a couple of cortisone injections. - Will send her to Dr. Vaughn Georges for cortisone injection to left hip for pain management - Schedule hip replacement surgery for late November.  Follow-Up Instructions: No follow-ups on file.   Orders:  Orders Placed This Encounter  Procedures   XR HIP UNILAT W OR W/O PELVIS 2-3 VIEWS LEFT   No orders of the defined types were placed  in this encounter.     Procedures: No procedures performed   Clinical Data: No additional findings.   Subjective: Chief Complaint  Patient presents with   Left Hip - Pain    HPI  Review of Systems  Constitutional: Negative.   HENT: Negative.    Eyes: Negative.   Respiratory: Negative.    Cardiovascular: Negative.   Endocrine: Negative.   Musculoskeletal: Negative.   Neurological: Negative.   Hematological: Negative.   Psychiatric/Behavioral: Negative.    All other systems reviewed and are negative.    Objective: Vital Signs: There were no vitals taken for this visit.  Physical Exam Vitals and nursing note reviewed.  Constitutional:      Appearance: She is well-developed.  HENT:     Head: Atraumatic.     Nose: Nose normal.  Eyes:     Extraocular Movements: Extraocular movements intact.  Cardiovascular:     Pulses: Normal pulses.  Pulmonary:     Effort: Pulmonary effort is normal.  Abdominal:     Palpations: Abdomen is soft.  Musculoskeletal:     Cervical back: Neck supple.  Skin:    General: Skin is warm.     Capillary Refill: Capillary refill takes less than 2 seconds.  Neurological:     Mental Status: She is alert. Mental status is at baseline.  Psychiatric:        Behavior: Behavior normal.  Thought Content: Thought content normal.        Judgment: Judgment normal.     Ortho Exam  Specialty Comments:  No specialty comments available.  Imaging: No results found.   PMFS History: Patient Active Problem List   Diagnosis Date Noted   Status post total hip replacement, right 07/16/2022   Primary osteoarthritis of right hip 06/12/2022   Open wound of knee, leg, and ankle 09/02/2010   Past Medical History:  Diagnosis Date   Anxiety    Cancer [facility]) 2013   right breast   Depression    Ectopic pregnancy 1981   Family history of adverse reaction to anesthesia    daughter has PONV   Hypertension     No family history on file.   Past Surgical History:  Procedure Laterality Date   ANKLE FRACTURE SURGERY Right    MASTECTOMY Right 2013   TOTAL HIP ARTHROPLASTY Right 07/16/2022   Procedure: RIGHT TOTAL HIP ARTHROPLASTY ANTERIOR APPROACH;  Surgeon: Wes Hamman, MD;  Location: [facility] OR;  Service: Orthopedics;  Laterality: Right;  3-C   UNILATERAL SALPINGECTOMY  1981   due to ectopic pregnancy   Social History   Occupational History   Not on file  Tobacco Use   Smoking status: Former    Current packs/day: 0.00    Types: Cigarettes    Quit date: 04/16/2003    Years since quitting: 21.0   Smokeless tobacco: Never  Vaping Use   Vaping status: Never Used  Substance and Sexual Activity   Alcohol use: Not Currently   Drug use: Not Currently   Sexual activity: Not on file

## 2024-05-06 DIAGNOSIS — X32XXXD Exposure to sunlight, subsequent encounter: Secondary | ICD-10-CM | POA: Diagnosis not present

## 2024-05-06 DIAGNOSIS — L570 Actinic keratosis: Secondary | ICD-10-CM | POA: Diagnosis not present

## 2024-08-04 ENCOUNTER — Encounter: Payer: Self-pay | Admitting: Sports Medicine

## 2024-08-04 ENCOUNTER — Ambulatory Visit (INDEPENDENT_AMBULATORY_CARE_PROVIDER_SITE_OTHER): Payer: Self-pay

## 2024-08-04 ENCOUNTER — Ambulatory Visit: Admitting: Sports Medicine

## 2024-08-04 ENCOUNTER — Ambulatory Visit: Admitting: Orthopaedic Surgery

## 2024-08-04 ENCOUNTER — Other Ambulatory Visit: Payer: Self-pay

## 2024-08-04 DIAGNOSIS — M1612 Unilateral primary osteoarthritis, left hip: Secondary | ICD-10-CM

## 2024-08-04 DIAGNOSIS — M25552 Pain in left hip: Secondary | ICD-10-CM | POA: Diagnosis not present

## 2024-08-04 MED ORDER — LIDOCAINE HCL 1 % IJ SOLN
4.0000 mL | INTRAMUSCULAR | Status: AC | PRN
Start: 2024-08-04 — End: 2024-08-04
  Administered 2024-08-04: 4 mL

## 2024-08-04 MED ORDER — METHYLPREDNISOLONE ACETATE 40 MG/ML IJ SUSP
80.0000 mg | INTRAMUSCULAR | Status: AC | PRN
  Administered 2024-08-04: 80 mg via INTRA_ARTICULAR

## 2024-08-04 NOTE — Progress Notes (Signed)
   Office Visit Note   Patient: Virginia Castillo           Date of Birth: Apr 06, 1951           MRN: [id number] Visit Date: 08/04/2024              Requested by: Valentin Skates, DO [address] [place],  [place] [address] PCP: Valentin Skates, DO   Assessment & Plan: Visit Diagnoses:  1. Primary osteoarthritis of left hip     Plan: History of Present Illness Virginia Castillo is a 73 year old female who presents with left hip pain.  She experiences an 'electrical shock' sensation in the left hip when stepping, occurring intermittently about once every week or two. This pain causes unsteadiness, especially without support. The pain is less severe than the right hip pain prior to surgery.  An injection in May provided approximately fifty percent relief, though the relief was gradual and difficult to quantify.  Assessment and Plan Left hip osteoarthritis Chronic left hip osteoarthritis with intermittent shock-like sensations causing instability and fall risk. Previous injection provided 50% relief. - Refer to Dr. Burnetta for hip injection. - Provide clearance sheet for Dr. Marjie for left total hip arthroplasty. - Schedule left total hip arthroplasty for early December. - Order new x-ray of left hip.  Follow-Up Instructions: No follow-ups on file.   Orders:  No orders of the defined types were placed in this encounter.  No orders of the defined types were placed in this encounter.

## 2024-08-04 NOTE — Progress Notes (Signed)
   Procedure Note  Patient: Virginia Castillo             Date of Birth: 1951-10-28           MRN: [id number]             Visit Date: 08/04/2024  Procedures: Visit Diagnoses:  1. Primary osteoarthritis of left hip   2. Pain in left hip    Large Joint Inj: L hip joint on 08/04/2024 10:49 AM Indications: pain Details: 22 G 3.5 in needle, ultrasound-guided anterior approach Medications: 4 mL lidocaine  1 %; 80 mg methylPREDNISolone  acetate 40 MG/ML Outcome: tolerated well, no immediate complications  Procedure: US -guided intra-articular hip injection, Left After discussion on risks/benefits/indications and informed verbal consent was obtained, a timeout was performed. Patient was lying supine on exam table. The hip was cleaned with betadine  and alcohol swabs . Then utilizing ultrasound guidance, the patient's femoral head and neck junction was identified and subsequently injected with 4:2 lidocaine :depomedrol via an in-plane approach with ultrasound visualization of the injectate administered into the hip joint. Patient tolerated procedure well without immediate complications.  Procedure, treatment alternatives, risks and benefits explained, specific risks discussed. Consent was given by the patient. Immediately prior to procedure a time out was called to verify the correct patient, procedure, equipment, support staff and site/side marked as required. Patient was prepped and draped in the usual sterile fashion.     - patient tolerated procedure well, discussed post-injection protocol - follow-up with Dr. Jerri as indicated; I am happy to see them as needed. She is scheduled for hip replacement in December.  Lonell Sprang, DO Primary Care Sports Medicine Physician  [facility]  This note was dictated using Dragon naturally speaking software and may contain errors in syntax, spelling, or content which have not been identified prior to signing this note.

## 2024-08-15 DIAGNOSIS — I493 Ventricular premature depolarization: Secondary | ICD-10-CM | POA: Diagnosis not present

## 2024-08-15 DIAGNOSIS — M810 Age-related osteoporosis without current pathological fracture: Secondary | ICD-10-CM | POA: Diagnosis not present

## 2024-08-15 DIAGNOSIS — I10 Essential (primary) hypertension: Secondary | ICD-10-CM | POA: Diagnosis not present

## 2024-08-15 DIAGNOSIS — H409 Unspecified glaucoma: Secondary | ICD-10-CM | POA: Diagnosis not present

## 2024-08-15 DIAGNOSIS — Z853 Personal history of malignant neoplasm of breast: Secondary | ICD-10-CM | POA: Diagnosis not present

## 2024-08-15 DIAGNOSIS — M8580 Other specified disorders of bone density and structure, unspecified site: Secondary | ICD-10-CM | POA: Diagnosis not present

## 2024-08-15 DIAGNOSIS — E785 Hyperlipidemia, unspecified: Secondary | ICD-10-CM | POA: Diagnosis not present

## 2024-08-15 DIAGNOSIS — M1990 Unspecified osteoarthritis, unspecified site: Secondary | ICD-10-CM | POA: Diagnosis not present

## 2024-08-15 DIAGNOSIS — G4700 Insomnia, unspecified: Secondary | ICD-10-CM | POA: Diagnosis not present

## 2024-08-16 ENCOUNTER — Telehealth: Payer: Self-pay

## 2024-08-16 NOTE — Telephone Encounter (Signed)
   Pre-operative Risk Assessment    Patient Name: Virginia Castillo  DOB: 02-Aug-1951 MRN: [id number]   Date of last office visit: NONE Date of next office visit: NONE   Request for Surgical Clearance    Procedure:  L THR  Date of Surgery:  Clearance TBD                                Surgeon:  DR PATRIA Socks Group or Practice Name:  [facility] Phone number:  [phone] Fax number:  [phone]   Type of Clearance Requested:   - Medical    Type of Anesthesia:  Not Indicated   Additional requests/questions:    Signed, Lucie DELENA Ku   08/16/2024, 5:29 PM

## 2024-08-17 NOTE — Telephone Encounter (Signed)
   Name: Virginia Castillo  DOB: 29-Jun-1951  MRN: [id number]  Primary Cardiologist: None  Chart reviewed as part of pre-operative protocol coverage. Because of [name] past medical history and time since last visit, she will require a follow-up in-office visit in order to better assess preoperative cardiovascular risk. Patient has not been seen by our cardiology practice in the past. Will need to be established and clearance obtained at that time.   Pre-op covering staff: - Please schedule appointment and call patient to inform them. If patient already had an upcoming appointment within acceptable timeframe, please add pre-op clearance to the appointment notes so provider is aware. - Please contact requesting surgeon's office via preferred method (i.e, phone, fax) to inform them of need for appointment prior to surgery.    Lamarr Satterfield, NP  08/17/2024, 8:02 AM

## 2024-08-17 NOTE — Telephone Encounter (Signed)
 Left message for pt to call our office to schedule a NEW PT/ PREOP IN OFFICE appt.

## 2024-08-25 NOTE — Telephone Encounter (Signed)
 2nd attempt to contact patient to scheduled new pt office visit for cardiac clearance.

## 2024-08-26 NOTE — Telephone Encounter (Signed)
 Patient will need to be called 2nd week of October.

## 2024-08-30 DIAGNOSIS — H401123 Primary open-angle glaucoma, left eye, severe stage: Secondary | ICD-10-CM | POA: Diagnosis not present

## 2024-08-30 DIAGNOSIS — H401111 Primary open-angle glaucoma, right eye, mild stage: Secondary | ICD-10-CM | POA: Diagnosis not present

## 2024-09-20 DIAGNOSIS — E785 Hyperlipidemia, unspecified: Secondary | ICD-10-CM | POA: Diagnosis not present

## 2024-09-20 DIAGNOSIS — I10 Essential (primary) hypertension: Secondary | ICD-10-CM | POA: Diagnosis not present

## 2024-09-20 DIAGNOSIS — M8580 Other specified disorders of bone density and structure, unspecified site: Secondary | ICD-10-CM | POA: Diagnosis not present

## 2024-09-20 DIAGNOSIS — Z0189 Encounter for other specified special examinations: Secondary | ICD-10-CM | POA: Diagnosis not present

## 2024-09-28 DIAGNOSIS — M1990 Unspecified osteoarthritis, unspecified site: Secondary | ICD-10-CM | POA: Diagnosis not present

## 2024-09-28 DIAGNOSIS — R4189 Other symptoms and signs involving cognitive functions and awareness: Secondary | ICD-10-CM | POA: Diagnosis not present

## 2024-09-28 DIAGNOSIS — M810 Age-related osteoporosis without current pathological fracture: Secondary | ICD-10-CM | POA: Diagnosis not present

## 2024-09-28 DIAGNOSIS — G4700 Insomnia, unspecified: Secondary | ICD-10-CM | POA: Diagnosis not present

## 2024-09-28 DIAGNOSIS — E785 Hyperlipidemia, unspecified: Secondary | ICD-10-CM | POA: Diagnosis not present

## 2024-09-28 DIAGNOSIS — Z853 Personal history of malignant neoplasm of breast: Secondary | ICD-10-CM | POA: Diagnosis not present

## 2024-09-28 DIAGNOSIS — I10 Essential (primary) hypertension: Secondary | ICD-10-CM | POA: Diagnosis not present

## 2024-09-28 DIAGNOSIS — Z0000 Encounter for general adult medical examination without abnormal findings: Secondary | ICD-10-CM | POA: Diagnosis not present

## 2024-09-28 DIAGNOSIS — I739 Peripheral vascular disease, unspecified: Secondary | ICD-10-CM | POA: Diagnosis not present

## 2024-10-10 ENCOUNTER — Other Ambulatory Visit (HOSPITAL_COMMUNITY): Payer: Self-pay | Admitting: Internal Medicine

## 2024-10-10 ENCOUNTER — Encounter: Payer: Self-pay | Admitting: Radiology

## 2024-10-10 ENCOUNTER — Ambulatory Visit (HOSPITAL_COMMUNITY)
Admission: RE | Admit: 2024-10-10 | Discharge: 2024-10-10 | Disposition: A | Discharge status: Home / Self Care | Source: Ambulatory Visit | Attending: Surgery | Admitting: Surgery

## 2024-10-10 DIAGNOSIS — I739 Peripheral vascular disease, unspecified: Secondary | ICD-10-CM | POA: Insufficient documentation

## 2024-10-10 LAB — VAS US ABI WITH/WO TBI
Left ABI: 0.97
Right ABI: 1.18

## 2024-10-12 DIAGNOSIS — Z1211 Encounter for screening for malignant neoplasm of colon: Secondary | ICD-10-CM | POA: Diagnosis not present

## 2024-10-13 DIAGNOSIS — F325 Major depressive disorder, single episode, in full remission: Secondary | ICD-10-CM | POA: Diagnosis not present

## 2024-10-13 DIAGNOSIS — Z96649 Presence of unspecified artificial hip joint: Secondary | ICD-10-CM | POA: Diagnosis not present

## 2024-10-13 DIAGNOSIS — I129 Hypertensive chronic kidney disease with stage 1 through stage 4 chronic kidney disease, or unspecified chronic kidney disease: Secondary | ICD-10-CM | POA: Diagnosis not present

## 2024-10-13 DIAGNOSIS — Z9011 Acquired absence of right breast and nipple: Secondary | ICD-10-CM | POA: Diagnosis not present

## 2024-10-13 DIAGNOSIS — Z9181 History of falling: Secondary | ICD-10-CM | POA: Diagnosis not present

## 2024-10-13 DIAGNOSIS — Z8249 Family history of ischemic heart disease and other diseases of the circulatory system: Secondary | ICD-10-CM | POA: Diagnosis not present

## 2024-10-13 DIAGNOSIS — E785 Hyperlipidemia, unspecified: Secondary | ICD-10-CM | POA: Diagnosis not present

## 2024-10-13 DIAGNOSIS — N181 Chronic kidney disease, stage 1: Secondary | ICD-10-CM | POA: Diagnosis not present

## 2024-10-13 DIAGNOSIS — F411 Generalized anxiety disorder: Secondary | ICD-10-CM | POA: Diagnosis not present

## 2024-10-13 DIAGNOSIS — M1990 Unspecified osteoarthritis, unspecified site: Secondary | ICD-10-CM | POA: Diagnosis not present

## 2024-10-13 DIAGNOSIS — Z59861 Financial insecurity, difficulty paying for utilities: Secondary | ICD-10-CM | POA: Diagnosis not present

## 2024-10-13 DIAGNOSIS — H409 Unspecified glaucoma: Secondary | ICD-10-CM | POA: Diagnosis not present

## 2024-10-13 DIAGNOSIS — Z809 Family history of malignant neoplasm, unspecified: Secondary | ICD-10-CM | POA: Diagnosis not present

## 2024-10-13 DIAGNOSIS — Z87891 Personal history of nicotine dependence: Secondary | ICD-10-CM | POA: Diagnosis not present

## 2024-10-18 ENCOUNTER — Telehealth: Payer: Self-pay | Admitting: Orthopaedic Surgery

## 2024-10-18 NOTE — Telephone Encounter (Signed)
 Returning patient's call regarding left total hip arthroplasty with Dr Jerri.  Left name and direct number on patient's voicemail.

## 2024-10-19 LAB — COLOGUARD: COLOGUARD: POSITIVE — AB

## 2024-11-02 NOTE — Patient Instructions (Signed)
 SURGICAL WAITING ROOM VISITATION  Patients having surgery or a procedure may have no more than 2 support people in the waiting area - these visitors may rotate.    Children under the age of 51 must have an adult with them who is not the patient.  Visitors with respiratory illnesses are discouraged from visiting and should remain at home.  If the patient needs to stay at the hospital during part of their recovery, the visitor guidelines for inpatient rooms apply. Pre-op nurse will coordinate an appropriate time for 1 support person to accompany patient in pre-op.  This support person may not rotate.    Please refer to the [facility] website for the visitor guidelines for Inpatients (after your surgery is over and you are in a regular room).       Your procedure is scheduled on: 11/18/24   Report to [facility] Main Entrance    Report to admitting at 5:15 AM   Call this number if you have problems the morning of surgery [phone]   Do not eat food :After Midnight.   After Midnight you may have the following liquids until 4:30 AM  DAY OF SURGERY  Water  Non-Citrus Juices (without pulp, NO RED-Apple, White grape, White cranberry) Black Coffee (NO MILK/CREAM OR CREAMERS, sugar ok)  Clear Tea (NO MILK/CREAM OR CREAMERS, sugar ok) regular and decaf                             Plain Jell-O (NO RED)                                           Fruit ices (not with fruit pulp, NO RED)                                     Popsicles (NO RED)                                                               Sports drinks like Gatorade (NO RED)             The day of surgery:  Drink ONE (1) Pre-Surgery Clear Ensure at 4:30 AM the morning of surgery. Drink in one sitting. Do not sip.  This drink was given to you during your hospital  pre-op appointment visit. Nothing else to drink after completing the  Pre-Surgery Clear Ensure.    Oral Hygiene is also important to reduce your risk  of infection.                                    Remember - BRUSH YOUR TEETH THE MORNING OF SURGERY WITH YOUR REGULAR TOOTHPASTE  DENTURES WILL BE REMOVED PRIOR TO SURGERY PLEASE DO NOT APPLY Poly grip OR ADHESIVES!!!     Stop all vitamins and herbal supplements 7 days before surgery.   Take these medicines the morning of surgery with A SIP OF WATER : Amlodipine , atorvastatin, clonazepam , sertraline.  You may not have any metal on your body including hair pins, jewelry, and body piercing             Do not wear make-up, lotions, powders, perfumes/cologne, or deodorant  Do not wear nail polish including gel and S&S, artificial/acrylic nails, or any other type of covering on natural nails including finger and toenails. If you have artificial nails, gel coating, etc. that needs to be removed by a nail salon please have this removed prior to surgery or surgery may need to be canceled/ delayed if the surgeon/ anesthesia feels like they are unable to be safely monitored.   Do not shave  48 hours prior to surgery.                Do not bring valuables to the hospital. [facility] IS NOT             RESPONSIBLE   FOR VALUABLES.   Contacts, glasses, dentures or bridgework may not be worn into surgery.   Bring small overnight bag day of surgery.   DO NOT BRING YOUR HOME MEDICATIONS TO THE HOSPITAL. PHARMACY WILL DISPENSE MEDICATIONS LISTED ON YOUR MEDICATION LIST TO YOU DURING YOUR ADMISSION IN THE HOSPITAL!    Patients discharged on the day of surgery will not be allowed to drive home.  Someone NEEDS to stay with you for the first 24 hours after anesthesia.   Special Instructions: Bring a copy of your healthcare power of attorney and living will documents the day of surgery if you haven't scanned them before.              Please read over the following fact sheets you were given: IF YOU HAVE QUESTIONS ABOUT YOUR PRE-OP INSTRUCTIONS PLEASE CALL [phone] [place]   If you  received a COVID test during your pre-op visit  it is requested that you wear a mask when out in public, stay away from anyone that may not be feeling well and notify your surgeon if you develop symptoms. If you test positive for Covid or have been in contact with anyone that has tested positive in the last 10 days please notify you surgeon.      Pre-operative 4 CHG Bath Instructions  DYNA-Hex 4 Chlorhexidine  Gluconate 4% Solution Antiseptic 4 fl. oz   You can play a key role in reducing the risk of infection after surgery. Your skin needs to be as free of germs as possible. You can reduce the number of germs on your skin by washing with CHG (chlorhexidine  gluconate) soap before surgery. CHG is an antiseptic soap that kills germs and continues to kill germs even after washing.   DO NOT use if you have an allergy to chlorhexidine /CHG or antibacterial soaps. If your skin becomes reddened or irritated, stop using the CHG and notify one of our RNs at   Please shower with the CHG soap starting 4 days before surgery using the following schedule:     Please keep in mind the following:  DO NOT shave, including legs and underarms, starting the day of your first shower.   You may shave your face at any point before/day of surgery.  Place clean sheets on your bed the day you start using CHG soap. Use a clean washcloth (not used since being washed) for each shower. DO NOT sleep with pets once you start using the CHG.  CHG Shower Instructions:  If you choose to wash your hair and private area, wash first with  your normal shampoo/soap.  After you use shampoo/soap, rinse your hair and body thoroughly to remove shampoo/soap residue.  Turn the water  OFF and apply about 3 tablespoons (45 ml) of CHG soap to a CLEAN washcloth.  Apply CHG soap ONLY FROM YOUR NECK DOWN TO YOUR TOES (washing for 3-5 minutes)  DO NOT use CHG soap on face, private areas, open wounds, or sores.  Pay special attention to the area  where your surgery is being performed.  If you are having back surgery, having someone wash your back for you may be helpful. Wait 2 minutes after CHG soap is applied, then you may rinse off the CHG soap.  Pat dry with a clean towel  Put on clean clothes/pajamas   If you choose to wear lotion, please use ONLY the CHG-compatible lotions on the back of this paper.     Additional instructions for the day of surgery: DO NOT APPLY any lotions, deodorants, cologne, or perfumes.   Put on clean/comfortable clothes.  Brush your teeth.  Ask your nurse before applying any prescription medications to the skin.   CHG Compatible Lotions   Aveeno Moisturizing lotion  Cetaphil Moisturizing Cream  Cetaphil Moisturizing Lotion  Clairol Herbal Essence Moisturizing Lotion, Dry Skin  Clairol Herbal Essence Moisturizing Lotion, Extra Dry Skin  Clairol Herbal Essence Moisturizing Lotion, Normal Skin  Curel Age Defying Therapeutic Moisturizing Lotion with Alpha Hydroxy  Curel Extreme Care Body Lotion  Curel Soothing Hands Moisturizing Hand Lotion  Curel Therapeutic Moisturizing Cream, Fragrance-Free  Curel Therapeutic Moisturizing Lotion, Fragrance-Free  Curel Therapeutic Moisturizing Lotion, Original Formula  Eucerin Daily Replenishing Lotion  Eucerin Dry Skin Therapy Plus Alpha Hydroxy Crme  Eucerin Dry Skin Therapy Plus Alpha Hydroxy Lotion  Eucerin Original Crme  Eucerin Original Lotion  Eucerin Plus Crme Eucerin Plus Lotion  Eucerin TriLipid Replenishing Lotion  Keri Anti-Bacterial Hand Lotion  Keri Deep Conditioning Original Lotion Dry Skin Formula Softly Scented  Keri Deep Conditioning Original Lotion, Fragrance Free Sensitive Skin Formula  Keri Lotion Fast Absorbing Fragrance Free Sensitive Skin Formula  Keri Lotion Fast Absorbing Softly Scented Dry Skin Formula  Keri Original Lotion  Keri Skin Renewal Lotion Keri Silky Smooth Lotion  Keri Silky Smooth Sensitive Skin Lotion  Nivea Body  Creamy Conditioning Oil  Nivea Body Extra Enriched Lotion  Nivea Body Original Lotion  Nivea Body Sheer Moisturizing Lotion Nivea Crme  Nivea Skin Firming Lotion  NutraDerm 30 Skin Lotion  NutraDerm Skin Lotion  NutraDerm Therapeutic Skin Cream  NutraDerm Therapeutic Skin Lotion  ProShield Protective Hand Cream  Incentive Spirometer  An incentive spirometer is a tool that can help keep your lungs clear and active. This tool measures how well you are filling your lungs with each breath. Taking long deep breaths may help reverse or decrease the chance of developing breathing (pulmonary) problems (especially infection) following: A long period of time when you are unable to move or be active. BEFORE THE PROCEDURE  If the spirometer includes an indicator to show your best effort, your nurse or respiratory therapist will set it to a desired goal. If possible, sit up straight or lean slightly forward. Try not to slouch. Hold the incentive spirometer in an upright position. INSTRUCTIONS FOR USE  Sit on the edge of your bed if possible, or sit up as far as you can in bed or on a chair. Hold the incentive spirometer in an upright position. Breathe out normally. Place the mouthpiece in your mouth and seal your lips tightly  around it. Breathe in slowly and as deeply as possible, raising the piston or the ball toward the top of the column. Hold your breath for 3-5 seconds or for as long as possible. Allow the piston or ball to fall to the bottom of the column. Remove the mouthpiece from your mouth and breathe out normally. Rest for a few seconds and repeat Steps 1 through 7 at least 10 times every 1-2 hours when you are awake. Take your time and take a few normal breaths between deep breaths. The spirometer may include an indicator to show your best effort. Use the indicator as a goal to work toward during each repetition. After each set of 10 deep breaths, practice coughing to be sure your lungs  are clear. If you have an incision (the cut made at the time of surgery), support your incision when coughing by placing a pillow or rolled up towels firmly against it. Once you are able to get out of bed, walk around indoors and cough well. You may stop using the incentive spirometer when instructed by your caregiver.  RISKS AND COMPLICATIONS Take your time so you do not get dizzy or light-headed. If you are in pain, you may need to take or ask for pain medication before doing incentive spirometry. It is harder to take a deep breath if you are having pain. AFTER USE Rest and breathe slowly and easily. It can be helpful to keep track of a log of your progress. Your caregiver can provide you with a simple table to help with this. If you are using the spirometer at home, follow these instructions: SEEK MEDICAL CARE IF:  You are having difficultly using the spirometer. You have trouble using the spirometer as often as instructed. Your pain medication is not giving enough relief while using the spirometer. You develop fever of 100.5 F (38.1 C) or higher. SEEK IMMEDIATE MEDICAL CARE IF:  You cough up bloody sputum that had not been present before. You develop fever of 102 F (38.9 C) or greater. You develop worsening pain at or near the incision site. MAKE SURE YOU:  Understand these instructions. Will watch your condition. Will get help right away if you are not doing well or get worse. Document Released: 04/06/2007 Document Revised: 02/16/2012 Document Reviewed: 06/07/2007   WHAT IS A BLOOD TRANSFUSION? Blood Transfusion Information  A transfusion is the replacement of blood or some of its parts. Blood is made up of multiple cells which provide different functions. Red blood cells carry oxygen and are used for blood loss replacement. White blood cells fight against infection. Platelets control bleeding. Plasma helps clot blood. Other blood products are available for specialized needs,  such as hemophilia or other clotting disorders. BEFORE THE TRANSFUSION  Who gives blood for transfusions?  Healthy volunteers who are fully evaluated to make sure their blood is safe. This is blood bank blood. Transfusion therapy is the safest it has ever been in the practice of medicine. Before blood is taken from a donor, a complete history is taken to make sure that person has no history of diseases nor engages in risky social behavior (examples are intravenous drug use or sexual activity with multiple partners). The donor's travel history is screened to minimize risk of transmitting infections, such as malaria. The donated blood is tested for signs of infectious diseases, such as HIV and hepatitis. The blood is then tested to be sure it is compatible with you in order to minimize the chance of  a transfusion reaction. If you or a relative donates blood, this is often done in anticipation of surgery and is not appropriate for emergency situations. It takes many days to process the donated blood. RISKS AND COMPLICATIONS Although transfusion therapy is very safe and saves many lives, the main dangers of transfusion include:  Getting an infectious disease. Developing a transfusion reaction. This is an allergic reaction to something in the blood you were given. Every precaution is taken to prevent this. The decision to have a blood transfusion has been considered carefully by your caregiver before blood is given. Blood is not given unless the benefits outweigh the risks. AFTER THE TRANSFUSION Right after receiving a blood transfusion, you will usually feel much better and more energetic. This is especially true if your red blood cells have gotten low (anemic). The transfusion raises the level of the red blood cells which carry oxygen, and this usually causes an energy increase. The nurse administering the transfusion will monitor you carefully for complications. HOME CARE INSTRUCTIONS  No special  instructions are needed after a transfusion. You may find your energy is better. Speak with your caregiver about any limitations on activity for underlying diseases you may have. SEEK MEDICAL CARE IF:  Your condition is not improving after your transfusion. You develop redness or irritation at the intravenous (IV) site. SEEK IMMEDIATE MEDICAL CARE IF:  Any of the following symptoms occur over the next 12 hours: Shaking chills. You have a temperature by mouth above 102 F (38.9 C), not controlled by medicine. Chest, back, or muscle pain. People around you feel you are not acting correctly or are confused. Shortness of breath or difficulty breathing. Dizziness and fainting. You get a rash or develop hives. You have a decrease in urine output. Your urine turns a dark color or changes to pink, red, or brown. Any of the following symptoms occur over the next 10 days: You have a temperature by mouth above 102 F (38.9 C), not controlled by medicine. Shortness of breath. Weakness after normal activity. The white part of the eye turns yellow (jaundice). You have a decrease in the amount of urine or are urinating less often. Your urine turns a dark color or changes to pink, red, or brown. Document Released: 11/21/2000 Document Revised: 02/16/2012 Document Reviewed: 07/10/2008 [facility] Patient Information 2014 [place], [place].

## 2024-11-02 NOTE — Progress Notes (Addendum)
 COVID Vaccine received:  [x]  No []  Yes Date of any COVID positive Test in last 90 days: no PCP - Massie Sewer DO Cardiologist - n/a  Chest x-ray -  EKG -   Stress Test -  ECHO -  Cardiac Cath -   Bowel Prep - [x]  No  []   Yes ______  Pacemaker / ICD device [x]  No []  Yes   Spinal Cord Stimulator:[x]  No []  Yes       History of Sleep Apnea? [x]  No []  Yes   CPAP used?- [x]  No []  Yes    Does the patient monitor blood sugar?          [x]  No []  Yes  []  N/A  Patient has: [x]  NO Hx DM   []  Pre-DM                 []  DM1  []   DM2 Does patient have a [employer] or Dexacom? []  No []  Yes   Fasting Blood Sugar Ranges-  Checks Blood Sugar _____ times a day  GLP1 agonist / usual dose - no GLP1 instructions:  SGLT-2 inhibitors / usual dose - no SGLT-2 instructions:   Blood Thinner / Instructions:no Aspirin  Instructions:no  Comments:   Activity level: Patient is able  to climb a flight of stairs without difficulty; [x]  No CP  [x]  No SOB,   Patient can perform ADLs without assistance.   Anesthesia review: Abnl. EKG.  Patient denies shortness of breath, fever, cough and chest pain at PAT appointment.  Patient verbalized understanding and agreement to the Pre-Surgical Instructions that were given to them at this PAT appointment. Patient was also educated of the need to review these PAT instructions again prior to his/her surgery.I reviewed the appropriate phone numbers to call if they have any and questions or concerns.

## 2024-11-07 ENCOUNTER — Other Ambulatory Visit: Payer: Self-pay

## 2024-11-07 ENCOUNTER — Encounter (HOSPITAL_COMMUNITY)
Admission: RE | Admit: 2024-11-07 | Discharge: 2024-11-07 | Disposition: A | Source: Ambulatory Visit | Attending: Orthopaedic Surgery | Admitting: Orthopaedic Surgery

## 2024-11-07 ENCOUNTER — Encounter (HOSPITAL_COMMUNITY): Payer: Self-pay

## 2024-11-07 VITALS — BP 147/69 | HR 81 | Temp 98.0°F | Resp 16 | Ht 61.0 in | Wt 129.0 lb

## 2024-11-07 DIAGNOSIS — I10 Essential (primary) hypertension: Secondary | ICD-10-CM | POA: Diagnosis not present

## 2024-11-07 DIAGNOSIS — I493 Ventricular premature depolarization: Secondary | ICD-10-CM | POA: Diagnosis not present

## 2024-11-07 DIAGNOSIS — Z01818 Encounter for other preprocedural examination: Secondary | ICD-10-CM | POA: Insufficient documentation

## 2024-11-07 DIAGNOSIS — I498 Other specified cardiac arrhythmias: Secondary | ICD-10-CM | POA: Insufficient documentation

## 2024-11-07 DIAGNOSIS — M1612 Unilateral primary osteoarthritis, left hip: Secondary | ICD-10-CM

## 2024-11-07 HISTORY — DX: Unspecified osteoarthritis, unspecified site: M19.90

## 2024-11-07 LAB — BASIC METABOLIC PANEL WITH GFR
Anion gap: 8 (ref 5–15)
BUN: 15 mg/dL (ref 8–23)
CO2: 27 mmol/L (ref 22–32)
Calcium: 9.5 mg/dL (ref 8.9–10.3)
Chloride: 103 mmol/L (ref 98–111)
Creatinine, Ser: 0.57 mg/dL (ref 0.44–1.00)
GFR, Estimated: >60 mL/min (ref >60)
Glucose, Bld: 91 mg/dL (ref 70–99)
Potassium: 3.4 mmol/L — ABNORMAL LOW (ref 3.5–5.1)
Sodium: 139 mmol/L (ref 135–145)

## 2024-11-07 LAB — CBC
HCT: 39 % (ref 36.0–46.0)
Hemoglobin: 12.2 g/dL (ref 12.0–15.0)
MCH: 29.7 pg (ref 26.0–34.0)
MCHC: 31.3 g/dL (ref 30.0–36.0)
MCV: 94.9 fL (ref 80.0–100.0)
Platelets: 320 K/uL (ref 150–400)
RBC: 4.11 MIL/uL (ref 3.87–5.11)
RDW: 13.9 % (ref 11.5–15.5)
WBC: 9.1 K/uL (ref 4.0–10.5)
nRBC: 0 % (ref 0.0–0.2)

## 2024-11-07 LAB — SURGICAL PCR SCREEN
MRSA, PCR: NEGATIVE
Staphylococcus aureus: NEGATIVE

## 2024-11-07 LAB — TYPE AND SCREEN
ABO/RH(D): A POS
Antibody Screen: NEGATIVE

## 2024-11-09 ENCOUNTER — Other Ambulatory Visit: Payer: Self-pay | Admitting: Physician Assistant

## 2024-11-09 MED ORDER — DOCUSATE SODIUM 100 MG PO CAPS: 100.0000 mg | ORAL_CAPSULE | Freq: Every day | ORAL | 2 refills | Status: AC | PRN

## 2024-11-09 MED ORDER — ONDANSETRON HCL 4 MG PO TABS: 4.0000 mg | ORAL_TABLET | Freq: Three times a day (TID) | ORAL | 0 refills | Status: AC | PRN

## 2024-11-09 MED ORDER — METHOCARBAMOL 750 MG PO TABS: 750.0000 mg | ORAL_TABLET | Freq: Three times a day (TID) | ORAL | 2 refills | Status: AC | PRN

## 2024-11-09 MED ORDER — OXYCODONE-ACETAMINOPHEN 5-325 MG PO TABS: 1.0000 | ORAL_TABLET | Freq: Four times a day (QID) | ORAL | 0 refills | Status: DC | PRN

## 2024-11-14 NOTE — Care Plan (Signed)
 Ortho Bundle Case Management Note  Patient Details  Name: Virginia Castillo MRN: [id number] Date of Birth: 1951-11-29   [facility] RNCM call to patient to discuss her upcoming Left total hip arthroplasty with Dr. Jerri on 11/18/24 at [facility]. She is agreeable to case management. She plans to return home with assistance from her daughter after discharge. She has a RW already. Anticipate HHPT will be needed after a short hospital stay. Referral made to [facility] after choice provided. Reviewed post op care instructions and questions answered. Will continue to follow for needs.                  DME Arranged:   (Has RW already) DME Agency:     HH Arranged:  PT HH Agency:  [facility] ([facility])  Additional Comments: Please contact me with any questions of if this plan should need to change.  Tylene Ned, RN, BSN, [employer]  [phone] 11/14/2024, 4:10 PM

## 2024-11-17 MED ORDER — TRANEXAMIC ACID 1000 MG/10ML IV SOLN
2000.0000 mg | INTRAVENOUS | Status: DC
  Filled 2024-11-17: qty 20

## 2024-11-18 ENCOUNTER — Encounter (HOSPITAL_COMMUNITY): Payer: Self-pay | Admitting: Medical

## 2024-11-18 ENCOUNTER — Ambulatory Visit (HOSPITAL_COMMUNITY)

## 2024-11-18 ENCOUNTER — Encounter (HOSPITAL_COMMUNITY): Admission: RE | Disposition: A | Payer: Self-pay | Source: Ambulatory Visit | Attending: Orthopaedic Surgery

## 2024-11-18 ENCOUNTER — Other Ambulatory Visit: Payer: Self-pay | Admitting: Physician Assistant

## 2024-11-18 ENCOUNTER — Other Ambulatory Visit: Payer: Self-pay

## 2024-11-18 ENCOUNTER — Ambulatory Visit (HOSPITAL_COMMUNITY): Admitting: Certified Registered"

## 2024-11-18 ENCOUNTER — Observation Stay (HOSPITAL_COMMUNITY)

## 2024-11-18 ENCOUNTER — Observation Stay (HOSPITAL_COMMUNITY)
Admission: RE | Admit: 2024-11-18 | Discharge: 2024-11-19 | Disposition: A | Source: Ambulatory Visit | Attending: Orthopaedic Surgery | Admitting: Orthopaedic Surgery

## 2024-11-18 ENCOUNTER — Encounter (HOSPITAL_COMMUNITY): Payer: Self-pay | Admitting: Orthopaedic Surgery

## 2024-11-18 DIAGNOSIS — Z87891 Personal history of nicotine dependence: Secondary | ICD-10-CM | POA: Diagnosis not present

## 2024-11-18 DIAGNOSIS — Z853 Personal history of malignant neoplasm of breast: Secondary | ICD-10-CM | POA: Insufficient documentation

## 2024-11-18 DIAGNOSIS — M1612 Unilateral primary osteoarthritis, left hip: Secondary | ICD-10-CM | POA: Diagnosis not present

## 2024-11-18 DIAGNOSIS — Z96641 Presence of right artificial hip joint: Secondary | ICD-10-CM | POA: Diagnosis not present

## 2024-11-18 DIAGNOSIS — Z79899 Other long term (current) drug therapy: Secondary | ICD-10-CM | POA: Diagnosis not present

## 2024-11-18 DIAGNOSIS — I10 Essential (primary) hypertension: Secondary | ICD-10-CM | POA: Diagnosis not present

## 2024-11-18 DIAGNOSIS — Z96642 Presence of left artificial hip joint: Secondary | ICD-10-CM

## 2024-11-18 DIAGNOSIS — M25552 Pain in left hip: Secondary | ICD-10-CM | POA: Diagnosis present

## 2024-11-18 SURGERY — ARTHROPLASTY, HIP, TOTAL, ANTERIOR APPROACH
Anesthesia: Monitor Anesthesia Care | Site: Hip | Laterality: Left

## 2024-11-18 MED ORDER — EPHEDRINE SULFATE-NACL 50-0.9 MG/10ML-% IV SOSY
PREFILLED_SYRINGE | INTRAVENOUS | Status: DC | PRN
  Administered 2024-11-18: 10 mg via INTRAVENOUS
  Administered 2024-11-18: 5 mg via INTRAVENOUS

## 2024-11-18 MED ORDER — PANTOPRAZOLE SODIUM 40 MG PO TBEC
40.0000 mg | DELAYED_RELEASE_TABLET | Freq: Every day | ORAL | Status: DC
  Administered 2024-11-18 – 2024-11-19 (×2): 40 mg via ORAL
  Filled 2024-11-18 (×2): qty 1

## 2024-11-18 MED ORDER — VANCOMYCIN HCL 1 G IV SOLR
INTRAVENOUS | Status: DC | PRN
  Administered 2024-11-18: 1000 mg

## 2024-11-18 MED ORDER — OXYCODONE HCL 5 MG PO TABS
5.0000 mg | ORAL_TABLET | Freq: Once | ORAL | Status: AC | PRN
  Administered 2024-11-18: 5 mg via ORAL

## 2024-11-18 MED ORDER — HYDROCODONE-ACETAMINOPHEN 5-325 MG PO TABS: 1.0000 | ORAL_TABLET | Freq: Three times a day (TID) | ORAL | Status: DC | PRN

## 2024-11-18 MED ORDER — TRANEXAMIC ACID-NACL 1000-0.7 MG/100ML-% IV SOLN
1000.0000 mg | Freq: Once | INTRAVENOUS | Status: AC
  Administered 2024-11-18: 1000 mg via INTRAVENOUS
  Filled 2024-11-18: qty 100

## 2024-11-18 MED ORDER — MIDAZOLAM HCL (PF) 2 MG/2ML IJ SOLN
INTRAMUSCULAR | Status: DC | PRN
  Administered 2024-11-18: 2 mg via INTRAVENOUS

## 2024-11-18 MED ORDER — ORAL CARE MOUTH RINSE: 15.0000 mL | Freq: Once | OROMUCOSAL | Status: AC

## 2024-11-18 MED ORDER — ASPIRIN 81 MG PO CHEW
81.0000 mg | CHEWABLE_TABLET | Freq: Two times a day (BID) | ORAL | Status: DC
  Administered 2024-11-18 – 2024-11-19 (×2): 81 mg via ORAL
  Filled 2024-11-18 (×2): qty 1

## 2024-11-18 MED ORDER — AMLODIPINE BESYLATE 5 MG PO TABS
5.0000 mg | ORAL_TABLET | Freq: Every day | ORAL | Status: DC
  Administered 2024-11-18 – 2024-11-19 (×2): 5 mg via ORAL
  Filled 2024-11-18 (×2): qty 1

## 2024-11-18 MED ORDER — METHOCARBAMOL 500 MG PO TABS
500.0000 mg | ORAL_TABLET | Freq: Four times a day (QID) | ORAL | Status: DC | PRN
  Administered 2024-11-18 – 2024-11-19 (×3): 500 mg via ORAL
  Filled 2024-11-18 (×2): qty 1

## 2024-11-18 MED ORDER — CEFAZOLIN SODIUM-DEXTROSE 2-4 GM/100ML-% IV SOLN
2.0000 g | INTRAVENOUS | Status: AC
  Administered 2024-11-18: 2 g via INTRAVENOUS
  Filled 2024-11-18: qty 100

## 2024-11-18 MED ORDER — PHENYLEPHRINE HCL-NACL 20-0.9 MG/250ML-% IV SOLN
INTRAVENOUS | Status: DC | PRN
  Administered 2024-11-18: 50 ug/min via INTRAVENOUS

## 2024-11-18 MED ORDER — FENTANYL CITRATE (PF) 250 MCG/5ML IJ SOLN
INTRAMUSCULAR | Status: DC | PRN
  Administered 2024-11-18: 25 ug via INTRAVENOUS
  Administered 2024-11-18: 50 ug via INTRAVENOUS
  Administered 2024-11-18: 25 ug via INTRAVENOUS

## 2024-11-18 MED ORDER — ASPIRIN 81 MG PO CHEW: 81.0000 mg | CHEWABLE_TABLET | Freq: Two times a day (BID) | ORAL | 0 refills | Status: DC

## 2024-11-18 MED ORDER — ONDANSETRON HCL 4 MG/2ML IJ SOLN: 4.0000 mg | Freq: Four times a day (QID) | INTRAMUSCULAR | Status: DC | PRN

## 2024-11-18 MED ORDER — MORPHINE SULFATE (PF) 2 MG/ML IV SOLN: 0.5000 mg | Freq: Four times a day (QID) | INTRAVENOUS | Status: DC | PRN

## 2024-11-18 MED ORDER — DEXAMETHASONE SOD PHOSPHATE PF 10 MG/ML IJ SOLN
10.0000 mg | Freq: Once | INTRAMUSCULAR | Status: AC
  Administered 2024-11-19: 10 mg via INTRAVENOUS

## 2024-11-18 MED ORDER — FENTANYL CITRATE (PF) 50 MCG/ML IJ SOSY
PREFILLED_SYRINGE | INTRAMUSCULAR | Status: AC
  Filled 2024-11-18: qty 1

## 2024-11-18 MED ORDER — ONDANSETRON HCL 4 MG PO TABS: 4.0000 mg | ORAL_TABLET | Freq: Four times a day (QID) | ORAL | Status: DC | PRN

## 2024-11-18 MED ORDER — CHLORHEXIDINE GLUCONATE 0.12 % MT SOLN
15.0000 mL | Freq: Once | OROMUCOSAL | Status: AC
  Administered 2024-11-18: 15 mL via OROMUCOSAL

## 2024-11-18 MED ORDER — ALUM & MAG HYDROXIDE-SIMETH 200-200-20 MG/5ML PO SUSP: 30.0000 mL | ORAL | Status: DC | PRN

## 2024-11-18 MED ORDER — OXYCODONE HCL 5 MG PO TABS
ORAL_TABLET | ORAL | Status: AC
  Filled 2024-11-18: qty 1

## 2024-11-18 MED ORDER — BUPIVACAINE-MELOXICAM ER 400-12 MG/14ML IJ SOLN
INTRAMUSCULAR | Status: AC
  Filled 2024-11-18: qty 1

## 2024-11-18 MED ORDER — DIPHENHYDRAMINE HCL 50 MG/ML IJ SOLN
INTRAMUSCULAR | Status: AC
  Filled 2024-11-18: qty 1

## 2024-11-18 MED ORDER — POLYETHYLENE GLYCOL 3350 17 G PO PACK
17.0000 g | PACK | Freq: Every day | ORAL | Status: DC
  Filled 2024-11-18 (×2): qty 1

## 2024-11-18 MED ORDER — SORBITOL 70 % SOLN: 30.0000 mL | Freq: Every day | Status: DC | PRN

## 2024-11-18 MED ORDER — ACETAMINOPHEN 500 MG PO TABS
1000.0000 mg | ORAL_TABLET | Freq: Four times a day (QID) | ORAL | Status: AC
  Administered 2024-11-18: 1000 mg via ORAL
  Filled 2024-11-18 (×2): qty 2

## 2024-11-18 MED ORDER — HYDROCODONE-ACETAMINOPHEN 7.5-325 MG PO TABS
1.0000 | ORAL_TABLET | Freq: Three times a day (TID) | ORAL | Status: DC | PRN
  Administered 2024-11-18 (×2): 1 via ORAL
  Administered 2024-11-19 (×2): 2 via ORAL
  Filled 2024-11-18: qty 1
  Filled 2024-11-18 (×3): qty 2

## 2024-11-18 MED ORDER — PROPOFOL 500 MG/50ML IV EMUL
INTRAVENOUS | Status: DC | PRN
  Administered 2024-11-18: 50 ug/kg/min via INTRAVENOUS

## 2024-11-18 MED ORDER — TRANEXAMIC ACID-NACL 1000-0.7 MG/100ML-% IV SOLN
1000.0000 mg | INTRAVENOUS | Status: AC
  Administered 2024-11-18: 1000 mg via INTRAVENOUS
  Filled 2024-11-18: qty 100

## 2024-11-18 MED ORDER — METOCLOPRAMIDE HCL 5 MG PO TABS: 5.0000 mg | ORAL_TABLET | Freq: Three times a day (TID) | ORAL | Status: DC | PRN

## 2024-11-18 MED ORDER — MENTHOL 3 MG MT LOZG: 1.0000 | LOZENGE | OROMUCOSAL | Status: DC | PRN

## 2024-11-18 MED ORDER — DIPHENHYDRAMINE HCL 12.5 MG/5ML PO ELIX: 25.0000 mg | ORAL_SOLUTION | ORAL | Status: DC | PRN

## 2024-11-18 MED ORDER — SERTRALINE HCL 100 MG PO TABS
100.0000 mg | ORAL_TABLET | Freq: Every day | ORAL | Status: DC
  Administered 2024-11-18 – 2024-11-19 (×2): 100 mg via ORAL
  Filled 2024-11-18 (×2): qty 1

## 2024-11-18 MED ORDER — ACETAMINOPHEN 325 MG PO TABS: 325.0000 mg | ORAL_TABLET | Freq: Four times a day (QID) | ORAL | Status: DC | PRN

## 2024-11-18 MED ORDER — BUPIVACAINE-MELOXICAM ER 200-6 MG/7ML IJ SOLN
INTRAMUSCULAR | Status: DC | PRN
  Administered 2024-11-18: 400 mg

## 2024-11-18 MED ORDER — ISOPROPYL ALCOHOL 70 % SOLN
Status: DC | PRN
  Administered 2024-11-18: 1 via TOPICAL

## 2024-11-18 MED ORDER — LACTATED RINGERS IV SOLN: INTRAVENOUS | Status: DC

## 2024-11-18 MED ORDER — POVIDONE-IODINE 10 % EX SWAB
2.0000 | Freq: Once | CUTANEOUS | Status: AC
  Administered 2024-11-18: 2 via TOPICAL

## 2024-11-18 MED ORDER — METHOCARBAMOL 1000 MG/10ML IJ SOLN: 500.0000 mg | Freq: Four times a day (QID) | INTRAMUSCULAR | Status: DC | PRN

## 2024-11-18 MED ORDER — OXYCODONE HCL 5 MG/5ML PO SOLN: 5.0000 mg | Freq: Once | ORAL | Status: AC | PRN

## 2024-11-18 MED ORDER — DOCUSATE SODIUM 100 MG PO CAPS
100.0000 mg | ORAL_CAPSULE | Freq: Two times a day (BID) | ORAL | Status: DC
  Administered 2024-11-18 – 2024-11-19 (×2): 100 mg via ORAL
  Filled 2024-11-18 (×2): qty 1

## 2024-11-18 MED ORDER — SODIUM CHLORIDE 0.9 % IV SOLN: INTRAVENOUS | Status: DC

## 2024-11-18 MED ORDER — FENTANYL CITRATE (PF) 50 MCG/ML IJ SOSY
25.0000 ug | PREFILLED_SYRINGE | INTRAMUSCULAR | Status: DC | PRN
  Administered 2024-11-18: 50 ug via INTRAVENOUS

## 2024-11-18 MED ORDER — METOCLOPRAMIDE HCL 5 MG/ML IJ SOLN: 5.0000 mg | Freq: Three times a day (TID) | INTRAMUSCULAR | Status: DC | PRN

## 2024-11-18 MED ORDER — BUPIVACAINE IN DEXTROSE 0.75-8.25 % IT SOLN
INTRATHECAL | Status: DC | PRN
  Administered 2024-11-18: 1.5 mL via INTRATHECAL

## 2024-11-18 MED ORDER — PHENOL 1.4 % MT LIQD: 1.0000 | OROMUCOSAL | Status: DC | PRN

## 2024-11-18 MED ORDER — DIPHENHYDRAMINE HCL 50 MG/ML IJ SOLN
12.5000 mg | Freq: Once | INTRAMUSCULAR | Status: AC
  Administered 2024-11-18: 12.5 mg via INTRAVENOUS

## 2024-11-18 MED ORDER — VANCOMYCIN HCL 1000 MG IV SOLR
INTRAVENOUS | Status: AC
  Filled 2024-11-18: qty 20

## 2024-11-18 MED ORDER — MAGNESIUM CITRATE PO SOLN: 1.0000 | Freq: Once | ORAL | Status: DC | PRN

## 2024-11-18 MED ORDER — CEFAZOLIN SODIUM-DEXTROSE 2-4 GM/100ML-% IV SOLN
2.0000 g | Freq: Four times a day (QID) | INTRAVENOUS | Status: AC
  Administered 2024-11-18 (×2): 2 g via INTRAVENOUS
  Filled 2024-11-18 (×2): qty 100

## 2024-11-18 MED ORDER — SODIUM CHLORIDE 0.9 % IR SOLN
Status: DC | PRN
  Administered 2024-11-18: 1000 mL

## 2024-11-18 MED ORDER — TRANEXAMIC ACID 1000 MG/10ML IV SOLN
INTRAVENOUS | Status: DC | PRN
  Administered 2024-11-18: 2000 mg via TOPICAL

## 2024-11-18 MED ORDER — FENTANYL CITRATE (PF) 100 MCG/2ML IJ SOLN
INTRAMUSCULAR | Status: AC
  Filled 2024-11-18: qty 2

## 2024-11-18 MED ADMIN — Clonazepam Tab 0.5 MG: 0.5 mg | ORAL | NDC 00904722761

## 2024-11-18 MED ADMIN — Sodium Chloride Irrigation Soln 0.9%: 1000 mL | NDC 99999050048

## 2024-11-18 MED FILL — Lidocaine HCl Local Preservative Free (PF) Inj 2%: INTRAMUSCULAR | Qty: 5 | Status: AC

## 2024-11-18 MED FILL — Clonazepam Tab 0.5 MG: 0.5000 mg | ORAL | Qty: 1 | Status: AC

## 2024-11-18 MED FILL — Methocarbamol Tab 500 MG: ORAL | Qty: 1 | Status: AC

## 2024-11-18 MED FILL — Midazolam HCl Inj 2 MG/2ML (Base Equivalent): INTRAMUSCULAR | Qty: 2 | Status: AC

## 2024-11-18 MED FILL — Propofol IV Emul 200 MG/20ML (10 MG/ML): INTRAVENOUS | Qty: 20 | Status: AC

## 2024-11-18 SURGICAL SUPPLY — 44 items
BAG COUNTER SPONGE SURGICOUNT (BAG) IMPLANT
BAG ZIPLOCK 12X15 (MISCELLANEOUS) ×1 IMPLANT
BALL HIP CERAMIC (Hips) IMPLANT
BLADE SAG 18X100X1.27 (BLADE) ×1 IMPLANT
CLSR STERI-STRIP ANTIMIC 1/2X4 (GAUZE/BANDAGES/DRESSINGS) IMPLANT
COVER PERINEAL POST (MISCELLANEOUS) ×1 IMPLANT
COVER SURGICAL LIGHT HANDLE (MISCELLANEOUS) ×1 IMPLANT
CUP ACETAB EMPH CMTLS 46 3H (Cup) IMPLANT
DERMABOND ADVANCED .7 DNX12 (GAUZE/BANDAGES/DRESSINGS) IMPLANT
DRAPE FOOT SWITCH (DRAPES) ×1 IMPLANT
DRAPE IMP U-DRAPE 54X76 (DRAPES) ×1 IMPLANT
DRAPE POUCH INSTRU U-SHP 10X18 (DRAPES) ×1 IMPLANT
DRAPE STERI IOBAN 125X83 (DRAPES) ×1 IMPLANT
DRAPE TOP 10253 STERILE (DRAPES) ×2 IMPLANT
DRAPE U-SHAPE 47X51 STRL (DRAPES) ×1 IMPLANT
DRSG AQUACEL AG ADV 3.5X10 (GAUZE/BANDAGES/DRESSINGS) ×1 IMPLANT
DURAPREP 26ML APPLICATOR (WOUND CARE) ×2 IMPLANT
ELECT PENCIL ROCKER SW 15FT (MISCELLANEOUS) ×1 IMPLANT
ELECT REM PT RETURN 15FT ADLT (MISCELLANEOUS) ×1 IMPLANT
GLOVE BIOGEL PI IND STRL 7.0 (GLOVE) ×1 IMPLANT
GLOVE BIOGEL PI IND STRL 7.5 (GLOVE) ×1 IMPLANT
GLOVE ECLIPSE 7.0 STRL STRAW (GLOVE) ×3 IMPLANT
GLOVE SURG SYN 7.5 PF PI (GLOVE) ×3 IMPLANT
GOWN SRG XL LVL 4 BRTHBL STRL (GOWNS) ×2 IMPLANT
HOOD PEEL AWAY T7 (MISCELLANEOUS) ×3 IMPLANT
KIT TURNOVER KIT A (KITS) ×1 IMPLANT
LINER ACET EMPH NTRL 32X46 +4 (Liner) IMPLANT
MARKER SKIN DUAL TIP RULER LAB (MISCELLANEOUS) ×1 IMPLANT
NDL SPNL 18GX3.5 QUINCKE PK (NEEDLE) ×1 IMPLANT
NEEDLE SPNL 18GX3.5 QUINCKE PK (NEEDLE) ×1 IMPLANT
PACK ANTERIOR HIP CUSTOM (KITS) ×1 IMPLANT
SCREW 6.5MMX25MM (Screw) IMPLANT
SET HNDPC FAN SPRY TIP SCT (DISPOSABLE) ×1 IMPLANT
SOLUTION PRONTOSAN WOUND 350ML (IRRIGATION / IRRIGATOR) ×1 IMPLANT
STEM FEM SZ3 STD ACTIS (Stem) IMPLANT
SUT ETHIBOND 2 V 37 (SUTURE) ×1 IMPLANT
SUT ETHILON 2 0 PS N (SUTURE) ×1 IMPLANT
SUT MNCRL AB 3-0 PS2 18 (SUTURE) IMPLANT
SUT NYLON 3 0 (SUTURE) IMPLANT
SUT STRATAFIX PDS+ 0 24IN (SUTURE) ×1 IMPLANT
SUT VIC AB 0 CT1 36 (SUTURE) ×2 IMPLANT
SUT VIC AB 2-0 CT1 TAPERPNT 27 (SUTURE) ×2 IMPLANT
TRAY CATH INTERMITTENT SS 16FR (CATHETERS) IMPLANT
TUBE SUCTION HIGH CAP CLEAR NV (SUCTIONS) ×1 IMPLANT

## 2024-11-18 NOTE — Op Note (Signed)
 ARTHROPLASTY, HIP, TOTAL, ANTERIOR APPROACH  Procedure Note Virginia Castillo   [id number]  Pre-op Diagnosis: left hip osteoarthritis     Post-op Diagnosis: same  Operative Findings Severe DJD Severe synovitis   Operative Procedures  1. Total hip replacement; Left hip; uncemented [id number]   Surgeon: Kay Cummins, M.D.  Assist: Ronal Morna Grave, PA-C   Anesthesia: spinal  Prosthesis: Depuy Acetabulum: Emphasys 46 mm Femur: Actis 3 STD Head: 32 mm size: +5 Liner: +4 neutral Bearing Type: ceramic/poly  Total Hip Arthroplasty (Anterior Approach) Op Note:  After informed consent was obtained and the operative extremity marked in the holding area, the patient was brought back to the operating room and placed supine on the HANA table. Next, the operative extremity was prepped and draped in normal sterile fashion. Surgical timeout occurred verifying patient identification, surgical site, surgical procedure and administration of antibiotics.  A 8 cm longitudinal incision was made starting from 2 fingerbreadths lateral and inferior to the ASIS towards the lateral aspect of the patella.  A Hueter approach to the hip was performed, using the interval between tensor fascia lata and sartorius.  Dissection was carried bluntly down onto the anterior hip capsule. The lateral femoral circumflex vessels were identified and coagulated. A capsulotomy was performed and the capsular flaps tagged for later repair.  The neck osteotomy was performed 1 fingerbreadth above the lesser trochanter. The femoral head was removed which showed severe wear, the acetabular rim was cleared of soft tissue and anterior osteophytes that caused impingement and attention was turned to reaming the acetabulum.  Sequential reaming was performed under fluoroscopic guidance down to the floor of the cotyloid fossa. We reamed to a size 55 mm, and then impacted the acetabular shell.  I chose an emphasys cup in order to fit a 32 mm head  ball instead of a 28 mm head ball with a 46 mm pinnacle cup.  A 25 mm cancellous screw was placed to augment fixation of the cup.  A +4 neutral liner was then placed after irrigation and attention turned to the femur.  After placing the femoral hook, the leg was taken to externally rotated, extended and adducted position taking care to perform soft tissue releases to allow for adequate mobilization of the femur. Soft tissue was cleared from the shoulder of the greater trochanter and the hook elevator used to improve exposure of the proximal femur.  Lateral bone from the shoulder was rasped away for relief.  Sequential broaching performed up to a size 3.  Standard trial neck and +5 head were placed. The leg was brought back up to neutral and the construct reduced.  The position and sizing of components, offset and leg lengths were checked using fluoroscopy.  Based on fluoroscopic findings, we chose to go with the construct.  Stability of the construct was checked in 45 degrees of hip extension and 90 degrees of external rotation without any subluxation, shuck or impingement of prosthesis. We dislocated the prosthesis, dropped the leg back into position, removed trial components, and irrigated copiously. The final stem and head were chosen then placed, the leg brought back up, the system reduced and fluoroscopy used to verify positioning.  Antibiotic irrigation was placed in the surgical wound.   We irrigated, obtained hemostasis and closed the capsule using #2 ethibond suture.  A topical mixture of 0.25% bupivacaine  and meloxicam  was placed deep to the fascia.  One gram of vancomycin  powder was placed in the surgical bed.   One gram of  topical tranexamic acid  was injected into the joint.  The fascia was closed with #1 stratafix, the deep fat layer was closed with 0 vicryl, the subcutaneous layers closed with 2.0 Vicryl Plus and the skin closed with 2.0 nylon and dermabond. A sterile dressing was applied. The  patient was awakened in the operating room and taken to recovery in stable condition.  All sponge, needle, and instrument counts were correct at the end of the case.   Morna Grave, my PA, was a medical necessity for opening, closing, limb positioning, retracting, exposing, and overall facilitation and timely completion of the surgery.  Position: supine  Complications: see description of procedure.  Time Out: performed   Drains/Packing: none  Estimated blood loss: see anesthesia record  Returned to Recovery Room: in good condition.   Antibiotics: yes   Mechanical VTE (DVT) Prophylaxis: sequential compression devices, TED thigh-high  Chemical VTE (DVT) Prophylaxis: aspirin    Fluid Replacement: see anesthesia record  Specimens Removed: 1 to pathology   Sponge and Instrument Count Correct? yes   PACU: portable radiograph - low AP   Plan/RTC: Return in 2 weeks for suture removal. Weight Bearing/Load Lower Extremity: full  Hip precautions: none Suture Removal: 2 weeks   N. Ozell Cummins, MD [facility] 8:50 AM   Implant Name Type Inv. Item Serial No. Manufacturer Lot No. LRB No. Used Action  CUP ACETAB EMPH CMTLS 46 3H - [id number] Cup CUP ACETAB EMPH CMTLS 46 3H  [employer] [id number] Left 1 Implanted  LINER ACET EMPH NTRL 32X46 +4 - [id number] Liner LINER ACET EMPH NTRL 32X46 +4  [employer] [id number] Left 1 Implanted  SCREW 6.5MMX25MM - [id number] Screw SCREW 6.5MMX25MM  [employer] [id number] Left 1 Implanted  STEM FEM SZ3 STD ACTIS - [id number] Stem STEM FEM SZ3 STD ACTIS  [employer] [id number] Left 1 Implanted  BALL HIP CERAMIC - [id number] Hips BALL HIP CERAMIC  [employer] [id number] Left 1 Implanted

## 2024-11-18 NOTE — Discharge Instructions (Signed)

## 2024-11-18 NOTE — Anesthesia Postprocedure Evaluation (Signed)
 Anesthesia Post Note  Patient: Virginia Castillo  Procedure(s) Performed: ARTHROPLASTY, HIP, TOTAL, ANTERIOR APPROACH (Left: Hip)     Patient location during evaluation: PACU Anesthesia Type: MAC and Spinal Level of consciousness: oriented and awake and alert Pain management: pain level controlled Vital Signs Assessment: post-procedure vital signs reviewed and stable Respiratory status: spontaneous breathing, respiratory function stable and patient connected to nasal cannula oxygen Cardiovascular status: blood pressure returned to baseline and stable Postop Assessment: no headache, no backache and no apparent nausea or vomiting Anesthetic complications: no   No notable events documented.  Last Vitals:  Vitals:   11/18/24 1030 11/18/24 1045  BP: 129/60 (!) 118/53  Pulse: (!) 59 65  Resp: 10 19  Temp: (!) 36 C   SpO2: 95% 98%    Last Pain:  Vitals:   11/18/24 1045  TempSrc:   PainSc: Asleep                 Aldridge Krzyzanowski S

## 2024-11-18 NOTE — TOC Transition Note (Signed)
 Transition of Care [facility]) - Discharge Note   Patient Details  Name: Virginia Castillo MRN: [id number] Date of Birth: 1951-02-24  Transition of [facility]) CM/SW Contact:  NORMAN ASPEN, LCSW Phone Number: 11/18/2024, 2:43 PM   Clinical Narrative:     Met with pt who confirms she has needed DME in the home.  HHPT prearranged with [facility] via ortho MD office prior to surgery.  No further IP CM needs.  Final next level of care: Home w Home Health Services Barriers to Discharge: No Barriers Identified   Patient Goals and CMS Choice Patient states their goals for this hospitalization and ongoing recovery are:: return home          Discharge Placement                       Discharge Plan and Services Additional resources added to the After Visit Summary for                  DME Arranged: N/A DME Agency: NA       HH Arranged: PT HH Agency: [facility]        Social Drivers of Health (SDOH) Interventions SDOH Screenings   Food Insecurity: Patient Declined (11/18/2024)  Housing: Patient Declined (11/18/2024)  Transportation Needs: Patient Declined (11/18/2024)  Utilities: Patient Declined (11/18/2024)  Social Connections: Patient Declined (11/18/2024)  Tobacco Use: Medium Risk (11/18/2024)     Readmission Risk Interventions     No data to display

## 2024-11-18 NOTE — Evaluation (Signed)
 Physical Therapy Evaluation Patient Details Name: Virginia Castillo MRN: [id number] DOB: [birth year] Today's Date: 11/18/2024  History of Present Illness  73 yo female presents to therapy s/p L THA, anterior approach on 11/18/2024 due to failure of conservative measures. Pt PMH includes but is not limited to: R ankle fx, R mastectomy, R THA (2023).  Clinical Impression    Virginia Castillo is a 73 y.o. female POD 0 s/p L THA. Patient reports IND with mobility at baseline. Patient is now limited by functional impairments (see PT problem list below) and requires S for bed mobility and CGA for transfers. Patient was able to ambulate 60 feet with RW and CGA level of assist. Patient instructed in exercise to facilitate ROM and circulation to manage edema. Patient will benefit from continued skilled PT interventions to address impairments and progress towards PLOF. Acute PT will follow to progress mobility and stair training in preparation for safe discharge home with family and social support with [facility] services.       If plan is discharge home, recommend the following: A little help with walking and/or transfers;A little help with bathing/dressing/bathroom;Assistance with cooking/housework;Assist for transportation;Help with stairs or ramp for entrance   Can travel by private vehicle        Equipment Recommendations None recommended by PT  Recommendations for Other Services       Functional Status Assessment Patient has had a recent decline in their functional status and demonstrates the ability to make significant improvements in function in a reasonable and predictable amount of time.     Precautions / Restrictions Precautions Precautions: Fall Restrictions Weight Bearing Restrictions Per Provider Order: Yes Other Position/Activity Restrictions: WBAT      Mobility  Bed Mobility Overal bed mobility: Needs Assistance Bed Mobility: Supine to Sit     Supine to sit: Supervision     General bed  mobility comments: min cues    Transfers Overall transfer level: Needs assistance Equipment used: Rolling walker (2 wheels) Transfers: Sit to/from Stand Sit to Stand: Contact guard assist           General transfer comment: min cues for safety with bed and commode transfers    Ambulation/Gait Ambulation/Gait assistance: Contact guard assist Gait Distance (Feet): 60 Feet Assistive device: Rolling walker (2 wheels) (pt personal RW) Gait Pattern/deviations: Step-to pattern, Decreased stance time - left, Trunk flexed, Antalgic Gait velocity: decreased     General Gait Details: slight trunk flexion with B UE support at RW to offload L LE in stance phase min cues for RW management  Stairs            Wheelchair Mobility     Tilt Bed    Modified Rankin (Stroke Patients Only)       Balance Overall balance assessment: Needs assistance Sitting-balance support: Feet supported Sitting balance-Leahy Scale: Good     Standing balance support: Bilateral upper extremity supported, During functional activity, Reliant on assistive device for balance Standing balance-Leahy Scale: Fair Standing balance comment: static standing no UE support                             Pertinent Vitals/Pain Pain Assessment Pain Assessment: 0-10 Pain Score: 8  Pain Location: L hip and LE Pain Descriptors / Indicators: Aching, Constant, Discomfort, Dull, Grimacing, Operative site guarding Pain Intervention(s): Limited activity within patient's tolerance, Monitored during session, Premedicated before session, Repositioned, Ice applied    Home Living Family/patient expects to  be discharged to:: Private residence Living Arrangements: Children (whom has Autism) Available Help at Discharge: Family;Friend(s) Type of Home: Apartment Home Access: Stairs to enter Entrance Stairs-Rails: Right Entrance Stairs-Number of Steps: 3   Home Layout: One level Home Equipment: Agricultural Consultant (2  wheels);Shower seat;Cane - single point      Prior Function Prior Level of Function : Independent/Modified Independent             Mobility Comments: IND with intermittent use of SPC for all ADLs, self care tasks and IADLs       Extremity/Trunk Assessment        Lower Extremity Assessment Lower Extremity Assessment: LLE deficits/detail LLE Deficits / Details: ankle DF/PF 5/5 LLE Sensation: WNL    Cervical / Trunk Assessment Cervical / Trunk Assessment: Normal  Communication   Communication Communication: No apparent difficulties    Cognition Arousal: Alert Behavior During Therapy: WFL for tasks assessed/performed   PT - Cognitive impairments: No apparent impairments                         Following commands: Intact       Cueing       General Comments      Exercises Total Joint Exercises Ankle Circles/Pumps: AROM, Both, 10 reps Quad Sets: AROM, Left, 10 reps Heel Slides: AROM, Left, 10 reps   Assessment/Plan    PT Assessment Patient needs continued PT services  PT Problem List Decreased strength;Decreased range of motion;Decreased activity tolerance;Decreased balance;Decreased coordination;Decreased mobility;Pain       PT Treatment Interventions DME instruction;Gait training;Stair training;Functional mobility training;Therapeutic activities;Therapeutic exercise;Balance training;Neuromuscular re-education;Patient/family education;Modalities    PT Goals (Current goals can be found in the Care Plan section)  Acute Rehab PT Goals Patient Stated Goal: to get a job PT Goal Formulation: With patient Time For Goal Achievement: 12/02/24 Potential to Achieve Goals: Good    Frequency 7X/week     Co-evaluation               AM-PAC PT 6 Clicks Mobility  Outcome Measure Help needed turning from your back to your side while in a flat bed without using bedrails?: None Help needed moving from lying on your back to sitting on the side of a  flat bed without using bedrails?: A Little Help needed moving to and from a bed to a chair (including a wheelchair)?: A Little Help needed standing up from a chair using your arms (e.g., wheelchair or bedside chair)?: A Little Help needed to walk in hospital room?: A Little Help needed climbing 3-5 steps with a railing? : Total 6 Click Score: 17    End of Session Equipment Utilized During Treatment: Gait belt Activity Tolerance: No increased pain;Patient tolerated treatment well Patient left: in bed;with call bell/phone within reach Nurse Communication: Mobility status PT Visit Diagnosis: Unsteadiness on feet (R26.81);Other abnormalities of gait and mobility (R26.89);Muscle weakness (generalized) (M62.81);Difficulty in walking, not elsewhere classified (R26.2);Pain Pain - Right/Left: Left Pain - part of body: Leg;Hip    Time: 8442-8364 PT Time Calculation (min) (ACUTE ONLY): 38 min   Charges:   PT Evaluation $PT Eval Low Complexity: 1 Low PT Treatments $Gait Training: 8-22 mins $Therapeutic Activity: 8-22 mins PT General Charges $$ ACUTE PT VISIT: 1 Visit         [place], PT Acute Rehab   [place] VEAR Drone 11/18/2024, 6:45 PM

## 2024-11-18 NOTE — Transfer of Care (Signed)
 Immediate Anesthesia Transfer of Care Note  Patient: Virginia Castillo  Procedure(s) Performed: ARTHROPLASTY, HIP, TOTAL, ANTERIOR APPROACH (Left: Hip)  Patient Location: PACU  Anesthesia Type:MAC, Spinal, and MAC combined with regional for post-op pain  Level of Consciousness: drowsy and patient cooperative  Airway & Oxygen Therapy: Patient Spontanous Breathing  Post-op Assessment: Report given to RN and Post -op Vital signs reviewed and stable  Post vital signs: Reviewed and stable  Last Vitals:  Vitals Value Taken Time  BP 125/60 11/18/24 09:25  Temp    Pulse 74 11/18/24 09:26  Resp 19 11/18/24 09:26  SpO2 100 % 11/18/24 09:26  Vitals shown include unfiled device data.  Last Pain:  Vitals:   11/18/24 0617  TempSrc:   PainSc: 0-No pain         Complications: No notable events documented.

## 2024-11-18 NOTE — H&P (Signed)
 PREOPERATIVE H&P  Chief Complaint: left hip osteoarthritis  HPI: Virginia Castillo is a 73 y.o. female who presents for surgical treatment of left hip osteoarthritis.  She denies any changes in medical history.  Past Surgical History:  Procedure Laterality Date   ANKLE FRACTURE SURGERY Right    MASTECTOMY Right 2013   TOTAL HIP ARTHROPLASTY Right 07/16/2022   Procedure: RIGHT TOTAL HIP ARTHROPLASTY ANTERIOR APPROACH;  Surgeon: Jerri Kay HERO, MD;  Location: [facility] OR;  Service: Orthopedics;  Laterality: Right;  3-C   UNILATERAL SALPINGECTOMY  1981   due to ectopic pregnancy   Social History   Socioeconomic History   Marital status: Divorced    Spouse name: Not on file   Number of children: 2   Years of education: Not on file   Highest education level: Not on file  Occupational History   Not on file  Tobacco Use   Smoking status: Former    Current packs/day: 0.00    Types: Cigarettes    Quit date: 04/16/2003    Years since quitting: 21.6   Smokeless tobacco: Never  Vaping Use   Vaping status: Never Used  Substance and Sexual Activity   Alcohol use: Not Currently   Drug use: Not Currently   Sexual activity: Not on file  Other Topics Concern   Not on file  Social History Narrative   Not on file   Social Drivers of Health   Tobacco Use: Medium Risk (11/07/2024)   Patient History    Smoking Tobacco Use: Former    Smokeless Tobacco Use: Never    Passive Exposure: Not on Actuary Strain: Not on file  Food Insecurity: Not on file  Transportation Needs: Not on file  Physical Activity: Not on file  Stress: Not on file  Social Connections: Unknown (04/20/2022)   Received from [facility]   Social Network    Social Network: Not on file  Depression (PHQ2-9): Not on file  Alcohol Screen: Not on file  Housing: Not on file  Utilities: Not on file  Health Literacy: Not on file   No family history on file. Allergies  Allergen Reactions    Nitrofurantoin Shortness Of Breath   Prior to Admission medications  Medication Sig Start Date End Date Taking? Authorizing Provider  amLODipine  (NORVASC ) 5 MG tablet Take 5 mg by mouth daily.   Yes [provider]  atorvastatin (LIPITOR) 40 MG tablet Take 40 mg by mouth daily.   Yes [provider]  clonazePAM  (KLONOPIN ) 0.5 MG tablet Take 0.5 mg by mouth daily as needed for anxiety.   Yes [provider]  docusate sodium  (COLACE) 100 MG capsule TAKE 1 CAPSULE(100 MG) BY MOUTH DAILY AS NEEDED 12/07/23  Yes Jule Ronal CROME, PA-C  docusate sodium  (COLACE) 100 MG capsule Take 1 capsule (100 mg total) by mouth daily as needed. 11/09/24 11/09/25  Jule Ronal CROME, PA-C  ibuprofen (ADVIL) 400 MG tablet Take 400 mg by mouth daily as needed for mild pain (pain score 1-3) or moderate pain (pain score 4-6). 06/27/24  Yes [provider]  latanoprost (XALATAN) 0.005 % ophthalmic solution Place 1 drop into the right eye at bedtime. 04/15/22  Yes [provider]  methocarbamol  (ROBAXIN ) 750 MG tablet Take 1 tablet (750 mg total) by mouth 3 (three) times daily as needed. 11/09/24   Jule Ronal CROME, PA-C  ondansetron  (ZOFRAN ) 4 MG tablet Take 1 tablet (4 mg total) by mouth every 8 (eight) hours  as needed for nausea or vomiting. 11/09/24   Jule Ronal CROME, PA-C  oxyCODONE -acetaminophen  (PERCOCET) 5-325 MG tablet Take 1-2 tablets by mouth every 6 (six) hours as needed. To be taken after surgery 11/09/24   Jule Ronal CROME, PA-C  predniSONE  (DELTASONE ) 20 MG tablet Take 20 mg by mouth 2 (two) times daily. 10/31/24  Yes [provider]  Promethazine-DM 15-6.25 MG/5ML SYRP Take 5 mLs by mouth every 8 (eight) hours as needed (cough). 10/26/24  Yes [provider]  sertraline (ZOLOFT) 100 MG tablet Take 100 mg by mouth daily.   Yes [provider]  traZODone  (DESYREL ) 100 MG tablet Take 150 mg by mouth at bedtime as needed for sleep.   Yes [provider]  VITAMIN D PO Take 1 tablet by mouth daily as needed.   Yes [provider]  bacitracin  ointment Apply 1 Application topically 2 (two) times daily. Patient not taking: Reported on 11/02/2024 01/02/24   Charlyn Sora, MD     Positive ROS: All other systems have been reviewed and were otherwise negative with the exception of those mentioned in the HPI and as above.  Physical Exam: General: Alert, no acute distress Cardiovascular: No pedal edema Respiratory: No cyanosis, no use of accessory musculature GI: abdomen soft Skin: No lesions in the area of chief complaint Neurologic: Sensation intact distally Psychiatric: Patient is competent for consent with normal mood and affect Lymphatic: no lymphedema  MUSCULOSKELETAL: exam stable  Assessment: left hip osteoarthritis  Plan: Plan for Procedures: ARTHROPLASTY, HIP, TOTAL, ANTERIOR APPROACH  The risks benefits and alternatives were discussed with the patient including but not limited to the risks of nonoperative treatment, versus surgical intervention including infection, bleeding, nerve injury,  blood clots, cardiopulmonary complications, morbidity, mortality, among others, and they were willing to proceed.   Ozell Cummins, MD 11/18/2024 6:10 AM

## 2024-11-18 NOTE — Plan of Care (Signed)
°  Problem: Education: °Goal: Knowledge of General Education information will improve °Description: Including pain rating scale, medication(s)/side effects and non-pharmacologic comfort measures °Outcome: Progressing °  °Problem: Activity: °Goal: Risk for activity intolerance will decrease °Outcome: Progressing °  °Problem: Elimination: °Goal: Will not experience complications related to urinary retention °Outcome: Progressing °  °

## 2024-11-18 NOTE — Anesthesia Procedure Notes (Signed)
 Spinal  Patient location during procedure: OR Start time: 11/18/2024 7:40 AM End time: 11/18/2024 7:43 AM Reason for block: surgical anesthesia  Staffing Performed: anesthesiologist  Authorized by: Maryclare Cornet, MD   Performed by: Maryclare Cornet, MD  Preanesthetic Checklist Completed: patient identified, IV checked, risks and benefits discussed, surgical consent, monitors and equipment checked, pre-op evaluation and timeout performed Spinal Block Patient position: sitting Prep: DuraPrep Patient monitoring: cardiac monitor, continuous pulse ox and blood pressure Approach: midline Location: L3-4 Injection technique: single-shot Needle Needle type: Pencan  Needle gauge: 24 G Needle length: 9 cm Assessment Sensory level: T10 Events: CSF return  Additional Notes Functioning IV was confirmed and monitors were applied. Sterile prep and drape, including hand hygiene and sterile gloves were used. The patient was positioned and the spine was prepped. The skin was anesthetized with lidocaine .  Free flow of clear CSF was obtained prior to injecting local anesthetic into the CSF.  The spinal needle aspirated freely following injection.  The needle was carefully withdrawn.  The patient tolerated the procedure well.

## 2024-11-18 NOTE — Anesthesia Preprocedure Evaluation (Signed)
 Anesthesia Evaluation  Patient identified by MRN, date of birth, ID band Patient awake    Reviewed: Allergy & Precautions, H&P , NPO status , Patient's Chart, lab work & pertinent test results  Airway Mallampati: II   Neck ROM: full    Dental   Pulmonary former smoker   breath sounds clear to auscultation       Cardiovascular hypertension,  Rhythm:regular Rate:Normal     Neuro/Psych  PSYCHIATRIC DISORDERS Anxiety Depression       GI/Hepatic   Endo/Other    Renal/GU      Musculoskeletal  (+) Arthritis ,    Abdominal   Peds  Hematology   Anesthesia Other Findings   Reproductive/Obstetrics                              Anesthesia Physical Anesthesia Plan  ASA: 2  Anesthesia Plan: MAC and Spinal   Post-op Pain Management:    Induction: Intravenous  PONV Risk Score and Plan: 2 and Propofol  infusion and Treatment may vary due to age or medical condition  Airway Management Planned: Simple Face Mask  Additional Equipment:   Intra-op Plan:   Post-operative Plan:   Informed Consent: I have reviewed the patients History and Physical, chart, labs and discussed the procedure including the risks, benefits and alternatives for the proposed anesthesia with the patient or authorized representative who has indicated his/her understanding and acceptance.     Dental advisory given  Plan Discussed with: CRNA, Anesthesiologist and Surgeon  Anesthesia Plan Comments:         Anesthesia Quick Evaluation

## 2024-11-19 DIAGNOSIS — M1612 Unilateral primary osteoarthritis, left hip: Secondary | ICD-10-CM | POA: Diagnosis not present

## 2024-11-19 MED ORDER — OXYCODONE-ACETAMINOPHEN 5-325 MG PO TABS: 1.0000 | ORAL_TABLET | Freq: Four times a day (QID) | ORAL | 0 refills | Status: AC | PRN

## 2024-11-19 NOTE — Progress Notes (Signed)
 Provided discharge education/instructions, all questions answered. Pt is alert and oriented x 4, not in any distress. Pt discharged home with all of her belongings accompanied by her friend.

## 2024-11-19 NOTE — Plan of Care (Signed)

## 2024-11-19 NOTE — Plan of Care (Signed)
  Problem: Activity: Goal: Ability to avoid complications of mobility impairment will improve Outcome: Progressing Goal: Ability to tolerate increased activity will improve Outcome: Progressing   Problem: Clinical Measurements: Goal: Postoperative complications will be avoided or minimized Outcome: Progressing   Problem: Pain Management: Goal: Pain level will decrease with appropriate interventions Outcome: Progressing   Problem: Skin Integrity: Goal: Will show signs of wound healing Outcome: Progressing   

## 2024-11-19 NOTE — Discharge Summary (Signed)
 Patient ID: Virginia Castillo MRN: [id number] DOB/AGE: 1951/12/04 73 y.o.  Admit date: 11/18/2024 Discharge date: 11/19/2024  Admission Diagnoses:  Principal Problem:   Primary osteoarthritis of left hip Active Problems:   Status post total replacement of left hip   Discharge Diagnoses:  Same  Past Medical History:  Diagnosis Date   Anxiety    Arthritis    Cancer (HCC) 2013   right breast   Depression    Ectopic pregnancy 1981   Family history of adverse reaction to anesthesia    daughter has PONV   Hypertension     Surgeries: Procedures: ARTHROPLASTY, HIP, TOTAL, ANTERIOR APPROACH on 11/18/2024   Consultants:   Discharged Condition: Improved  Hospital Course: Virginia Castillo is an 73 y.o. female who was admitted 11/18/2024 for operative treatment ofPrimary osteoarthritis of left hip. Patient has severe unremitting pain that affects sleep, daily activities, and work/hobbies. After pre-op clearance the patient was taken to the operating room on 11/18/2024 and underwent  Procedures: ARTHROPLASTY, HIP, TOTAL, ANTERIOR APPROACH.    Patient was given perioperative antibiotics:  Anti-infectives (From admission, onward)    Start     Dose/Rate Route Frequency Ordered Stop   11/18/24 1400  ceFAZolin  (ANCEF ) IVPB 2g/100 mL premix        2 g 200 mL/hr over 30 Minutes Intravenous Every 6 hours 11/18/24 1204 11/19/24 1211   11/18/24 0813  vancomycin  (VANCOCIN ) powder  Status:  Discontinued          As needed 11/18/24 0813 11/18/24 0922   11/18/24 0600  ceFAZolin  (ANCEF ) IVPB 2g/100 mL premix        2 g 200 mL/hr over 30 Minutes Intravenous On call to O.R. 11/18/24 [id number] 11/18/24 0748        Patient was given sequential compression devices, early ambulation, and chemoprophylaxis to prevent DVT.  Inpatient Morphine  Milligram Equivalents Per Day 12/12 - 12/13   Values displayed are in units of MME/Day    Order Start / End Date Yesterday Today    oxyCODONE  (Oxy IR/ROXICODONE ) immediate  release tablet 5 mg 12/12 - 12/12 7.5 of Unknown --    oxyCODONE  (ROXICODONE ) 5 MG/5ML solution 5 mg 12/12 - 12/12 0 of Unknown --      Group total: 7.5 of Unknown     HYDROcodone -acetaminophen  (NORCO/VICODIN) 5-325 MG per tablet 1-2 tablet 12/12 - No end date 0 of 10-20 0 of 15-30    HYDROcodone -acetaminophen  (NORCO) 7.5-325 MG per tablet 1-2 tablet 12/12 - No end date 15 of 15-30 30 of 22.5-45    morphine  (PF) 2 MG/ML injection 0.5-1 mg 12/12 - No end date 0 of 3-6 0 of 6-12    fentaNYL  citrate (PF) (SUBLIMAZE ) injection 12/12 - 12/12 *30 of 30 --    fentaNYL  (SUBLIMAZE ) injection 25-50 mcg 12/12 - 12/12 15 of 45-90 --    Daily Totals  * 67.5 of Unknown (at least 103-176) 30 of 43.5-87  *One-Step medication  Calculation Errors     Order Type Date Details   oxyCODONE  (Oxy IR/ROXICODONE ) immediate release tablet 5 mg Ordered Dose -- Insufficient frequency information   oxyCODONE  (ROXICODONE ) 5 MG/5ML solution 5 mg Ordered Dose -- Insufficient frequency information            Patient benefited maximally from hospital stay and there were no complications.    Recent vital signs: Patient Vitals for the past 24 hrs:  BP Temp Temp src Pulse Resp SpO2  11/19/24 1442 (!) 112/50 98.3 F (36.8 C) Oral  95 18 92 %  11/19/24 1235 (!) 119/50 -- -- -- -- --  11/19/24 1013 (!) 114/97 98.5 F (36.9 C) Oral 91 18 97 %  11/19/24 0606 (!) 114/55 98.6 F (37 C) Oral 94 17 98 %  11/19/24 0157 110/61 98.6 F (37 C) -- 88 15 98 %  11/18/24 2144 137/60 98.9 F (37.2 C) Oral 98 16 91 %     Recent laboratory studies: No results for input(s): WBC, HGB, HCT, PLT, NA, K, CL, CO2, BUN, CREATININE, GLUCOSE, INR, CALCIUM in the last 72 hours.  Invalid input(s): PT, 2   Discharge Medications:   Allergies as of 11/19/2024       Reactions   Nitrofurantoin Shortness Of Breath        Medication List     STOP taking these medications    bacitracin  ointment    ibuprofen 400 MG tablet Commonly known as: ADVIL       TAKE these medications    amLODipine  5 MG tablet Commonly known as: NORVASC  Take 5 mg by mouth daily.   aspirin  81 MG chewable tablet Commonly known as: Aspirin  81 Chew 1 tablet (81 mg total) by mouth 2 (two) times daily. To be taken after surgery to prevent blood clots   atorvastatin 40 MG tablet Commonly known as: LIPITOR Take 40 mg by mouth daily. Notes to patient: Go back to taking this   clonazePAM  0.5 MG tablet Commonly known as: KLONOPIN  Take 0.5 mg by mouth daily as needed for anxiety. Notes to patient: Last time you had taken thi was yesterday at 09:48pm   docusate sodium  100 MG capsule Commonly known as: Colace Take 1 capsule (100 mg total) by mouth daily as needed. What changed: Another medication with the same name was removed. Continue taking this medication, and follow the directions you see here. Notes to patient: Last time you had taken this was today at 10:24am   latanoprost 0.005 % ophthalmic solution Commonly known as: XALATAN Place 1 drop into the right eye at bedtime. Notes to patient: Go back to taking this   methocarbamol  750 MG tablet Commonly known as: ROBAXIN  Take 1 tablet (750 mg total) by mouth 3 (three) times daily as needed. Notes to patient: Last time you had taken this was today at 10:24am   ondansetron  4 MG tablet Commonly known as: Zofran  Take 1 tablet (4 mg total) by mouth every 8 (eight) hours as needed for nausea or vomiting. Notes to patient: Go back to taking this   oxyCODONE -acetaminophen  5-325 MG tablet Commonly known as: Percocet Take 1-2 tablets by mouth every 6 (six) hours as needed. To be taken after surgery   predniSONE  20 MG tablet Commonly known as: DELTASONE  Take 20 mg by mouth 2 (two) times daily. Notes to patient: Go back to taking this   Promethazine-DM 15-6.25 MG/5ML Syrp Take 5 mLs by mouth every 8 (eight) hours as needed (cough). Notes to patient:  Go back to taking this   sertraline  100 MG tablet Commonly known as: ZOLOFT  Take 100 mg by mouth daily.   traZODone  100 MG tablet Commonly known as: DESYREL  Take 150 mg by mouth at bedtime as needed for sleep. Notes to patient: Go back to taking this   VITAMIN D PO Take 1 tablet by mouth daily as needed. Notes to patient: Go back to taking this               Durable Medical Equipment  (From admission, onward)  Start     Ordered   11/18/24 1205  DME Walker rolling  Once       Question:  Patient needs a walker to treat with the following condition  Answer:  History of hip replacement   11/18/24 1204   11/18/24 1205  DME 3 n 1  Once        11/18/24 1204   11/18/24 1205  DME Bedside commode  Once       Question:  Patient needs a bedside commode to treat with the following condition  Answer:  History of hip replacement   11/18/24 1204            Diagnostic Studies: DG Pelvis Portable Result Date: 11/18/2024 EXAM: 1 OR 2 VIEW(S) XRAY OF THE PELVIS 11/18/2024 09:41:00 AM COMPARISON: 08/04/2024. CLINICAL HISTORY: Pain. FINDINGS: BONES AND JOINTS: Status post total left hip arthroplasty, new. Right hip arthroplasty in anatomic alignment. SOFT TISSUES: Subcutaneous soft tissue edema and gas on the left. IMPRESSION: 1. Status post new total left hip arthroplasty. 2. Right hip arthroplasty in anatomic alignment. Electronically signed by: Pinkie Pebbles MD 11/18/2024 08:13 PM EST RP Workstation: [id number]   DG HIP UNILAT WITH PELVIS 1V LEFT Result Date: 11/18/2024 EXAM: 1 VIEW(S) XRAY OF THE LEFT HIP 11/18/2024 08:57:00 AM COMPARISON: Pelvic radiographs dated 08/04/2024 without report. CLINICAL HISTORY: Surgery, elective [id number]. FINDINGS: BONES AND JOINTS: Marked left hip osteoarthritis is demonstrated. There is a prior right hip arthroplasty. Subsequent images demonstrate placement of a left total hip arthroplasty without hardware complication. SOFT TISSUES: The  soft tissues are unremarkable. IMPRESSION: 1. Intraoperative imaging of left hip arthroplasty without hardware complication. Electronically signed by: Rockey Kilts MD 11/18/2024 01:17 PM EST RP Workstation: [id number]   DG C-Arm 1-60 Min-No Report Result Date: 11/18/2024 Fluoroscopy was utilized by the requesting physician.  No radiographic interpretation.    Disposition: Discharge disposition: 01-Home or Self Care          Follow-up Information     Jule Ronal CROME, PA-C. Go on 12/06/2024.   Specialty: Orthopedic Surgery Why: at 9:30 am for your first post operative appointment in Dr. Benjiman office. Contact information: [address]  [place] [address] [phone]         [facility]) Follow up.   Specialty: Home Health Services Why: Agency will provide Home Health Physical Therapy Contact information: [address] [place] [address] [place]  [address] [phone]                 Signed: Lonni CINDERELLA Poli 11/19/2024, 5:41 PM

## 2024-11-19 NOTE — Progress Notes (Signed)
 Subjective: 1 Day Post-Op Procedures (LRB): ARTHROPLASTY, HIP, TOTAL, ANTERIOR APPROACH (Left) Patient reports pain as moderate.  Per the patient, she did not make good progress with physical therapy yesterday but hopes to potentially go home later today if she mobilizes well and clears PT.  She would rather be at home she states.  Objective: Vital signs in last 24 hours: Temp:  [97.9 F (36.6 C)-98.9 F (37.2 C)] 98.5 F (36.9 C) (12/13 1013) Pulse Rate:  [73-107] 91 (12/13 1013) Resp:  [15-18] 18 (12/13 1013) BP: (110-148)/(49-97) 114/97 (12/13 1013) SpO2:  [90 %-100 %] 97 % (12/13 1013) Weight:  [58.5 kg] 58.5 kg (12/12 1200)  Intake/Output from previous day: 12/12 0701 - 12/13 0700 In: 3076.2 [P.O.:1100; I.V.:1876.2; IV Piggyback:100] Out: 1000 [Urine:750; Blood:250] Intake/Output this shift: No intake/output data recorded.  No results for input(s): HGB in the last 72 hours. No results for input(s): WBC, RBC, HCT, PLT in the last 72 hours. No results for input(s): NA, K, CL, CO2, BUN, CREATININE, GLUCOSE, CALCIUM in the last 72 hours. No results for input(s): LABPT, INR in the last 72 hours.  Sensation intact distally Intact pulses distally Dorsiflexion/Plantar flexion intact Incision: dressing C/D/I   Assessment/Plan: 1 Day Post-Op Procedures (LRB): ARTHROPLASTY, HIP, TOTAL, ANTERIOR APPROACH (Left) Up with therapy Discharge home with home health this afternoon if clears therapy.      [place] Castillo 11/19/2024, 11:34 AM

## 2024-11-19 NOTE — Care Management Obs Status (Signed)
 MEDICARE OBSERVATION STATUS NOTIFICATION   Patient Details  Name: Virginia Castillo MRN: [id number] Date of Birth: [birth year]   Medicare Observation Status Notification Given:  Yes    Sonda Manuella Quill, RN 11/19/2024, 9:58 AM

## 2024-11-19 NOTE — Progress Notes (Addendum)
 Physical Therapy Treatment Patient Details Name: Virginia Castillo MRN: [id number] DOB: 03-11-1951 Today's Date: 11/19/2024   History of Present Illness 73 yo female presents to therapy s/p L THA, anterior approach on 11/18/2024 due to failure of conservative measures. Pt PMH includes but is not limited to: R ankle fx, R mastectomy, R THA (2023).    PT Comments  Pt agreeable to working with therapy. She participated well. Practiced gait and stair training on today. She did report some dizziness during session BP 119/50-made RN aware. Encouraged pt to ambulate often at home, as tolerated. Pt is hopeful to d/c home today. Plan is for HHPT f/u.     If plan is discharge home, recommend the following: A little help with walking and/or transfers;A little help with bathing/dressing/bathroom;Assistance with cooking/housework;Assist for transportation;Help with stairs or ramp for entrance   Can travel by private vehicle        Equipment Recommendations  None recommended by PT    Recommendations for Other Services       Precautions / Restrictions Precautions Precautions: Fall Restrictions Weight Bearing Restrictions Per Provider Order: No LLE Weight Bearing Per Provider Order: Weight bearing as tolerated     Mobility  Bed Mobility               General bed mobility comments: sitting EOB    Transfers Overall transfer level: Needs assistance Equipment used: Rolling walker (2 wheels) Transfers: Sit to/from Stand Sit to Stand: Supervision           General transfer comment: Cues for safety, hand placement. Increased time    Ambulation/Gait Ambulation/Gait assistance: Contact guard assist Gait Distance (Feet): 150 Feet (x2) Assistive device: Rolling walker (2 wheels) Gait Pattern/deviations: Decreased stride length, Step-through pattern       General Gait Details: Fair gait speed. Mild lightheadedness reported by patient-was able to make it to/from gym without  incident.   Stairs Stairs: Yes Stairs assistance: Contact guard assist Stair Management: One rail Left Number of Stairs: 3 General stair comments: up and down stairs in gym. cues for safety, sequencing, technique. pt c/o dizziness after turning around to come down stairs. she stood for a bit-dizziness lessened-pt then able to come back down without incident. seated rest break taken   Wheelchair Mobility     Tilt Bed    Modified Rankin (Stroke Patients Only)       Balance Overall balance assessment: Needs assistance         Standing balance support: Bilateral upper extremity supported, During functional activity, Reliant on assistive device for balance Standing balance-Leahy Scale: Fair                              Hotel Manager: No apparent difficulties  Cognition Arousal: Alert Behavior During Therapy: WFL for tasks assessed/performed   PT - Cognitive impairments: No apparent impairments                         Following commands: Intact      Cueing    Exercises      General Comments        Pertinent Vitals/Pain Pain Assessment Pain Assessment: Faces Faces Pain Scale: Hurts even more Pain Location: L hip/thigh Pain Descriptors / Indicators: Burning Pain Intervention(s): Monitored during session    Home Living  Prior Function            PT Goals (current goals can now be found in the care plan section) Progress towards PT goals: Progressing toward goals    Frequency    7X/week      PT Plan      Co-evaluation              AM-PAC PT 6 Clicks Mobility   Outcome Measure  Help needed turning from your back to your side while in a flat bed without using bedrails?: A Little Help needed moving from lying on your back to sitting on the side of a flat bed without using bedrails?: A Little Help needed moving to and from a bed to a chair (including a  wheelchair)?: A Little Help needed standing up from a chair using your arms (e.g., wheelchair or bedside chair)?: A Little Help needed to walk in hospital room?: A Little Help needed climbing 3-5 steps with a railing? : A Little 6 Click Score: 18    End of Session Equipment Utilized During Treatment: Gait belt Activity Tolerance: Patient tolerated treatment well Patient left: in bed;with call bell/phone within reach   PT Visit Diagnosis: Unsteadiness on feet (R26.81);Other abnormalities of gait and mobility (R26.89);Muscle weakness (generalized) (M62.81);Difficulty in walking, not elsewhere classified (R26.2);Pain Pain - Right/Left: Left Pain - part of body: Hip     Time: 1220-1245 PT Time Calculation (min) (ACUTE ONLY): 25 min  Charges:    $Gait Training: 23-37 mins PT General Charges $$ ACUTE PT VISIT: 1 Visit                         Dannial SQUIBB, PT Acute Rehabilitation  Office: [phone]

## 2024-11-21 ENCOUNTER — Encounter (HOSPITAL_COMMUNITY): Payer: Self-pay | Admitting: Orthopaedic Surgery

## 2024-12-06 ENCOUNTER — Ambulatory Visit (INDEPENDENT_AMBULATORY_CARE_PROVIDER_SITE_OTHER): Admitting: Physician Assistant

## 2024-12-06 DIAGNOSIS — Z96642 Presence of left artificial hip joint: Secondary | ICD-10-CM

## 2024-12-06 NOTE — Progress Notes (Signed)
" ° °  Post-Op Visit Note   Patient: [place] Castillo           Date of Birth: 03/08/1951           MRN: [id number] Visit Date: 12/06/2024 PCP: Valentin Skates, DO   Assessment & Plan:  Chief Complaint:  Chief Complaint  Patient presents with   Left Hip - Routine Post Op    L THA 11/18/2024   Visit Diagnoses:  1. Status post total replacement of left hip     Plan: Patient is a pleasant 73 year old female who comes in today 2 weeks status post left total hip replacement.  She has been doing well.  She has occasional pain which is relieved with muscle relaxers which she primarily takes at night.  She has been compliant taking her baby aspirin  twice daily for DVT prophylaxis.  She has been getting home health PT.  She is walking without assistance.  Examination of her left hip reveals a well-healed surgical incision with nylon sutures in place.  No evidence of infection or cellulitis.  Calves are soft nontender.  She is neurovascularly intact distally.  Today, sutures were removed and Steri-Strips applied.  Continue with aspirin  twice daily for another 4 weeks.  Follow-up in 4 weeks for repeat evaluation and AP pelvis x-rays.  Call with concerns or questions.  Follow-Up Instructions: Return in about 4 weeks (around 01/03/2025).   Orders:  No orders of the defined types were placed in this encounter.  No orders of the defined types were placed in this encounter.   Imaging: No new imaging  PMFS History: Patient Active Problem List   Diagnosis Date Noted   Status post total replacement of left hip 11/18/2024   Primary osteoarthritis of left hip 08/04/2024   Status post total hip replacement, right 07/16/2022   Open wound of knee, leg, and ankle 09/02/2010   Past Medical History:  Diagnosis Date   Anxiety    Arthritis    Cancer (HCC) 2013   right breast   Depression    Ectopic pregnancy 1981   Family history of adverse reaction to anesthesia    daughter has PONV   Hypertension      No family history on file.  Past Surgical History:  Procedure Laterality Date   ANKLE FRACTURE SURGERY Right    MASTECTOMY Right 2013   TOTAL HIP ARTHROPLASTY Right 07/16/2022   Procedure: RIGHT TOTAL HIP ARTHROPLASTY ANTERIOR APPROACH;  Surgeon: Jerri Kay HERO, MD;  Location: [facility] OR;  Service: Orthopedics;  Laterality: Right;  3-C   TOTAL HIP ARTHROPLASTY Left 11/18/2024   Procedure: ARTHROPLASTY, HIP, TOTAL, ANTERIOR APPROACH;  Surgeon: Jerri Kay HERO, MD;  Location: [facility] ORS;  Service: Orthopedics;  Laterality: Left;   UNILATERAL SALPINGECTOMY  1981   due to ectopic pregnancy   Social History   Occupational History   Not on file  Tobacco Use   Smoking status: Former    Current packs/day: 0.00    Types: Cigarettes    Quit date: 04/16/2003    Years since quitting: 21.6   Smokeless tobacco: Never  Vaping Use   Vaping status: Never Used  Substance and Sexual Activity   Alcohol  use: Not Currently   Drug use: Not Currently   Sexual activity: Not on file     "

## 2024-12-15 ENCOUNTER — Other Ambulatory Visit: Payer: Self-pay | Admitting: Physician Assistant

## 2024-12-15 ENCOUNTER — Telehealth: Payer: Self-pay | Admitting: Orthopaedic Surgery

## 2024-12-15 MED ORDER — HYDROCODONE-ACETAMINOPHEN 5-325 MG PO TABS: 1.0000 | ORAL_TABLET | Freq: Two times a day (BID) | ORAL | 0 refills | Status: AC | PRN

## 2024-12-15 NOTE — Telephone Encounter (Signed)
Weaning to norco and sent to pharmacy on file

## 2024-12-15 NOTE — Telephone Encounter (Signed)
 Pt called requesting a refill of oxycodone  to [facility]. Pt number is [phone].

## 2024-12-16 ENCOUNTER — Telehealth: Payer: Self-pay

## 2024-12-16 ENCOUNTER — Ambulatory Visit: Admitting: Physician Assistant

## 2024-12-16 ENCOUNTER — Other Ambulatory Visit: Payer: Self-pay

## 2024-12-16 DIAGNOSIS — M545 Low back pain, unspecified: Secondary | ICD-10-CM

## 2024-12-16 DIAGNOSIS — Z96642 Presence of left artificial hip joint: Secondary | ICD-10-CM | POA: Diagnosis not present

## 2024-12-16 MED ORDER — METHYLPREDNISOLONE 4 MG PO TBPK: ORAL_TABLET | ORAL | 0 refills | Status: AC

## 2024-12-16 NOTE — Progress Notes (Signed)
 "  Office Visit Note   Patient: [place] Castillo           Date of Birth: [birth year]           MRN: [id number] Visit Date: 12/16/2024              Requested by: Valentin Skates, DO [address] [place],  [place] [address] PCP: Valentin Skates, DO   Assessment & Plan: Visit Diagnoses:  1. Left low back pain, unspecified chronicity, unspecified whether sciatica present   2. Status post total replacement of left hip     Plan: Impression is left-sided parascapular pain from a questionable fall 3 days ago.  I do not see any acute fracture on x-ray.  Her symptoms seem more muscular on today's exam.  Oxygen saturation is acceptable and she does not currently have any chest pain.  We have discussed treating this symptomatically and I sent in a Medrol  Dosepak to hopefully help.  She has been instructed to go to the ED should she have shortness of breath.  Follow-up with us  as needed for her back.  Follow-Up Instructions: Return if symptoms worsen or fail to improve, for prn for back.  regularly scheduled appt for hip.   Orders:  Orders Placed This Encounter  Procedures   XR HIP UNILAT W OR W/O PELVIS 2-3 VIEWS LEFT   XR Lumbar Spine 2-3 Views   XR Thoracic Spine 2 View   Meds ordered this encounter  Medications   methylPREDNISolone  (MEDROL  DOSEPAK) 4 MG TBPK tablet    Sig: Take as directed    Dispense:  21 tablet    Refill:  0      Procedures: No procedures performed   Clinical Data: No additional findings.   Subjective: Chief Complaint  Patient presents with   Left Hip - Pain   Lower Back - Pain    HPI patient is a pleasant 74 year old female who comes in today with back pain.  She tells me this past Tuesday, she woke up with pain to the mid to lower thoracic spine on the left.  She thinks she awoke on the floor so that she must of fallen off the bed but is somewhat unsure.  The pain she has is constant but worse when she is taking deep breaths and when she is twisting and  occasionally when she is bending over.  She denies any paresthesias or weakness to either upper or lower extremities.  No bowel or bladder change or saddle paresthesias.  She is approximately 4 weeks status post left total hip replacement doing well there but has been taking pain medicine and thinks this may have put her in a deep sleep.  She denies any chest pain or shortness of breath.  Review of Systems as detailed in HPI.  All others reviewed and are negative.   Objective: Vital Signs: There were no vitals taken for this visit.  Physical Exam well-developed well-nourished female in no acute distress.  Alert and oriented x 3.  O2 sat on room air is 97%.  Pulse is 75 bpm.  Ortho Exam left hip exam.  No pain with hip flexion logroll.  Spine exam: She has moderate tenderness to the left parascapular region.  No spinous tenderness.  No pain with lumbar flexion or extension.  She has slight pain with rotation.  No focal weakness.  She is neurovascularly intact distally.  Specialty Comments:  No specialty comments available.  Imaging: XR Thoracic Spine 2 View Result Date:  12/16/2024 Moderate degenerative changes with anterior osteophyte formation  XR Lumbar Spine 2-3 Views Result Date: 12/16/2024 Moderate multilevel degenerative changes with evidence of scoliosis   XR HIP UNILAT W OR W/O PELVIS 2-3 VIEWS LEFT Result Date: 12/16/2024 Well-seated prosthesis without complication    PMFS History: Patient Active Problem List   Diagnosis Date Noted   Status post total replacement of left hip 11/18/2024   Primary osteoarthritis of left hip 08/04/2024   Status post total hip replacement, right 07/16/2022   Open wound of knee, leg, and ankle 09/02/2010   Past Medical History:  Diagnosis Date   Anxiety    Arthritis    Cancer (HCC) 2013   right breast   Depression    Ectopic pregnancy 1981   Family history of adverse reaction to anesthesia    daughter has PONV   Hypertension     No  family history on file.  Past Surgical History:  Procedure Laterality Date   ANKLE FRACTURE SURGERY Right    MASTECTOMY Right 2013   TOTAL HIP ARTHROPLASTY Right 07/16/2022   Procedure: RIGHT TOTAL HIP ARTHROPLASTY ANTERIOR APPROACH;  Surgeon: Jerri Kay HERO, MD;  Location: [facility] OR;  Service: Orthopedics;  Laterality: Right;  3-C   TOTAL HIP ARTHROPLASTY Left 11/18/2024   Procedure: ARTHROPLASTY, HIP, TOTAL, ANTERIOR APPROACH;  Surgeon: Jerri Kay HERO, MD;  Location: [facility];  Service: Orthopedics;  Laterality: Left;   UNILATERAL SALPINGECTOMY  1981   due to ectopic pregnancy   Social History   Occupational History   Not on file  Tobacco Use   Smoking status: Former    Current packs/day: 0.00    Types: Cigarettes    Quit date: 04/16/2003    Years since quitting: 21.6   Smokeless tobacco: Never  Vaping Use   Vaping status: Never Used  Substance and Sexual Activity   Alcohol  use: Not Currently   Drug use: Not Currently   Sexual activity: Not on file        "

## 2024-12-16 NOTE — Telephone Encounter (Signed)
Worked patient in.  

## 2024-12-16 NOTE — Telephone Encounter (Signed)
 Patient called stating that she has been having back pain since Tuesday.  Stated that she is not sure what may have happened or if she fell out of the bed while she was sleeping, but the pain has gotten worse.  Patient had left hip surgery on 11/18/2024.  CB# [phone].  Please advise.  Thank you.

## 2024-12-16 NOTE — Telephone Encounter (Signed)
 Any groin or thigh pain?  Does anyone  have openings today?  MA or jackson?

## 2024-12-22 ENCOUNTER — Telehealth: Payer: Self-pay | Admitting: Orthopaedic Surgery

## 2024-12-22 ENCOUNTER — Other Ambulatory Visit: Payer: Self-pay | Admitting: Physician Assistant

## 2024-12-22 MED ORDER — PREDNISONE 5 MG (21) PO TBPK: ORAL_TABLET | ORAL | 0 refills | Status: AC

## 2024-12-22 NOTE — Telephone Encounter (Signed)
 Patient called. Would like prednisone  called in for her.

## 2024-12-22 NOTE — Telephone Encounter (Signed)
 Sent to pharmacy on file

## 2025-01-03 ENCOUNTER — Encounter: Admitting: Physician Assistant

## 2025-01-12 ENCOUNTER — Encounter: Admitting: Physician Assistant

## 2025-01-12 ENCOUNTER — Other Ambulatory Visit: Payer: Self-pay | Admitting: Physician Assistant

## 2025-01-13 NOTE — Telephone Encounter (Signed)
Only needs for 6 weeks post-op

## 2025-01-19 ENCOUNTER — Encounter: Admitting: Physician Assistant
